# Patient Record
Sex: Female | Born: 1960 | Race: White | Hispanic: No | Smoking: Never smoker
Health system: Southern US, Community
[De-identification: ages and names within clinical notes are randomized; demographics above are authoritative.]

## PROBLEM LIST (undated history)

## (undated) DIAGNOSIS — D649 Anemia, unspecified: Secondary | ICD-10-CM

## (undated) DIAGNOSIS — G4730 Sleep apnea, unspecified: Secondary | ICD-10-CM

## (undated) DIAGNOSIS — I493 Ventricular premature depolarization: Secondary | ICD-10-CM

## (undated) DIAGNOSIS — B379 Candidiasis, unspecified: Secondary | ICD-10-CM

## (undated) DIAGNOSIS — K589 Irritable bowel syndrome without diarrhea: Secondary | ICD-10-CM

## (undated) HISTORY — PX: LASIK: SHX215

## (undated) HISTORY — DX: Sleep apnea, unspecified: G47.30

## (undated) HISTORY — DX: Irritable bowel syndrome, unspecified: K58.9

## (undated) HISTORY — DX: Candidiasis, unspecified: B37.9

## (undated) HISTORY — PX: FACIAL COSMETIC SURGERY: SHX629

## (undated) HISTORY — DX: Ventricular premature depolarization: I49.3

## (undated) HISTORY — DX: Anemia, unspecified: D64.9

---

## 2011-12-22 ENCOUNTER — Ambulatory Visit (INDEPENDENT_AMBULATORY_CARE_PROVIDER_SITE_OTHER): Payer: BC Managed Care – PPO | Admitting: Obstetrics and Gynecology

## 2011-12-22 ENCOUNTER — Encounter: Payer: Self-pay | Admitting: Obstetrics and Gynecology

## 2011-12-22 ENCOUNTER — Ambulatory Visit (INDEPENDENT_AMBULATORY_CARE_PROVIDER_SITE_OTHER): Payer: BC Managed Care – PPO

## 2011-12-22 ENCOUNTER — Other Ambulatory Visit: Payer: Self-pay | Admitting: Obstetrics and Gynecology

## 2011-12-22 VITALS — BP 110/76 | Ht 64.6 in | Wt 176.0 lb

## 2011-12-22 DIAGNOSIS — Z1231 Encounter for screening mammogram for malignant neoplasm of breast: Secondary | ICD-10-CM

## 2011-12-22 DIAGNOSIS — Z309 Encounter for contraceptive management, unspecified: Secondary | ICD-10-CM

## 2011-12-22 DIAGNOSIS — Z01419 Encounter for gynecological examination (general) (routine) without abnormal findings: Secondary | ICD-10-CM

## 2011-12-22 NOTE — Progress Notes (Signed)
The patient reports:normal menses, no abnormal bleeding, pelvic pain or discharge  Contraception:IUD Mirena since 2009  Last mammogram: was normal 2 years ago per pt  Last pap: was normal 2 years ago per pt   GC/Chlamydia cultures offered: declined HIV/RPR/HbsAg offered:  declined HSV 1 and 2 glycoprotein offered: declined  Menstrual cycle regular and monthly: No: IUD  Menstrual flow normal: No:IUD   Urinary symptoms: none Normal bowel movements: Yes Reports abuse at home: No  Subjective:    Sandra Benitez is a 51 y.o. female, G1P1001, who presents for an annual exam.     History   Social History  . Marital Status: Unknown    Spouse Name: N/A    Number of Children: N/A  . Years of Education: N/A   Social History Main Topics  . Smoking status: Never Smoker   . Smokeless tobacco: Never Used  . Alcohol Use: No  . Drug Use: No  . Sexually Active: Not Currently    Birth Control/ Protection: IUD   Other Topics Concern  . None   Social History Narrative  . None    Menstrual cycle:   LMP: No LMP recorded. Patient is not currently having periods (Reason: IUD).           Cycle: amenorrhea, occasional spotting  The following portions of the patient's history were reviewed and updated as appropriate: allergies, current medications, past family history, past medical history, past social history, past surgical history and problem list.  Review of Systems Pertinent items are noted in HPI. Breast:Negative for breast lump,nipple discharge or nipple retraction Gastrointestinal: Negative for abdominal pain, change in bowel habits or rectal bleeding Urinary: urgency with occasional incontinence, nycturia 0-1, no USI   Objective:    BP 110/76  Ht 5' 4.6" (1.641 m)  Wt 176 lb (79.833 kg)  BMI 29.65 kg/m2    Weight:  Wt Readings from Last 1 Encounters:  12/22/11 176 lb (79.833 kg)          BMI: Body mass index is 29.65 kg/(m^2).  General Appearance: Alert, appropriate appearance  for age. No acute distress HEENT: Grossly normal Neck / Thyroid: Supple, no masses, nodes or enlargement Lungs: clear to auscultation bilaterally Back: No CVA tenderness Breast Exam: No masses or nodes.No dimpling, nipple retraction or discharge.[place] todsat Cardiovascular: Regular rate and rhythm. S1, S2, no murmur Gastrointestinal: Soft, non-tender, no masses or organomegaly Pelvic Exam: Vulva and vagina appear normal. Bimanual exam reveals normal uterus and adnexa. slightly RV and strings not seen. Rectovaginal: normal rectal, no masses Lymphatic Exam: Non-palpable nodes in neck, clavicular, axillary, or inguinal regions Skin: no rash or abnormalities Neurologic: Normal gait and speech, no tremor  Psychiatric: Alert and oriented, appropriate affect.   Ultrasound : IUD in uterus and well placed   3 small fibroids  <3 cm   Assessment:    Normal gyn exam    Plan:    mammogram pap smear return annually or prn STD screening: declined Contraception:IUD      Freida Busman JMD

## 2011-12-25 LAB — PAP IG W/ RFLX HPV ASCU

## 2012-01-17 ENCOUNTER — Ambulatory Visit: Payer: Self-pay

## 2012-02-12 ENCOUNTER — Ambulatory Visit
Admission: RE | Admit: 2012-02-12 | Discharge: 2012-02-12 | Disposition: A | Discharge status: Home / Self Care | Payer: BC Managed Care – PPO | Source: Ambulatory Visit | Attending: Obstetrics and Gynecology | Admitting: Obstetrics and Gynecology

## 2012-02-12 DIAGNOSIS — Z1231 Encounter for screening mammogram for malignant neoplasm of breast: Secondary | ICD-10-CM

## 2012-02-15 ENCOUNTER — Encounter: Payer: Self-pay | Admitting: Obstetrics and Gynecology

## 2012-02-15 NOTE — Progress Notes (Signed)
Quick Note:  Please send "Dense breast" letter to patient and document in chart when letter is sent. ______ 

## 2013-01-14 ENCOUNTER — Other Ambulatory Visit: Payer: Self-pay

## 2013-01-14 DIAGNOSIS — Z1231 Encounter for screening mammogram for malignant neoplasm of breast: Secondary | ICD-10-CM

## 2013-02-12 ENCOUNTER — Ambulatory Visit
Admission: RE | Admit: 2013-02-12 | Discharge: 2013-02-12 | Disposition: A | Discharge status: Home / Self Care | Payer: 59 | Source: Ambulatory Visit

## 2013-02-12 DIAGNOSIS — Z1231 Encounter for screening mammogram for malignant neoplasm of breast: Secondary | ICD-10-CM

## 2013-04-03 LAB — HM COLONOSCOPY: HM Colonoscopy: NORMAL

## 2013-05-13 LAB — HM COLONOSCOPY: HM Colonoscopy: NORMAL

## 2014-01-26 ENCOUNTER — Other Ambulatory Visit: Payer: Self-pay

## 2014-01-26 DIAGNOSIS — Z1231 Encounter for screening mammogram for malignant neoplasm of breast: Secondary | ICD-10-CM

## 2014-02-16 ENCOUNTER — Ambulatory Visit
Admission: RE | Admit: 2014-02-16 | Discharge: 2014-02-16 | Disposition: A | Discharge status: Home / Self Care | Payer: 59 | Source: Ambulatory Visit

## 2014-02-16 DIAGNOSIS — Z1231 Encounter for screening mammogram for malignant neoplasm of breast: Secondary | ICD-10-CM

## 2015-02-02 LAB — HM PAP SMEAR

## 2015-02-02 LAB — HM MAMMOGRAPHY: HM Mammogram: NORMAL (ref 0–4)

## 2015-05-15 ENCOUNTER — Emergency Department (HOSPITAL_COMMUNITY): Payer: 59

## 2015-05-15 ENCOUNTER — Emergency Department (HOSPITAL_COMMUNITY)
Admission: EM | Admit: 2015-05-15 | Discharge: 2015-05-15 | Disposition: A | Discharge status: Home / Self Care | Payer: 59 | Attending: Emergency Medicine | Admitting: Emergency Medicine

## 2015-05-15 ENCOUNTER — Encounter (HOSPITAL_COMMUNITY): Payer: Self-pay | Admitting: Emergency Medicine

## 2015-05-15 DIAGNOSIS — Y9289 Other specified places as the place of occurrence of the external cause: Secondary | ICD-10-CM | POA: Insufficient documentation

## 2015-05-15 DIAGNOSIS — Z8719 Personal history of other diseases of the digestive system: Secondary | ICD-10-CM | POA: Insufficient documentation

## 2015-05-15 DIAGNOSIS — Y9389 Activity, other specified: Secondary | ICD-10-CM | POA: Insufficient documentation

## 2015-05-15 DIAGNOSIS — F419 Anxiety disorder, unspecified: Secondary | ICD-10-CM | POA: Diagnosis not present

## 2015-05-15 DIAGNOSIS — S92512A Displaced fracture of proximal phalanx of left lesser toe(s), initial encounter for closed fracture: Secondary | ICD-10-CM | POA: Insufficient documentation

## 2015-05-15 DIAGNOSIS — Z880 Allergy status to penicillin: Secondary | ICD-10-CM | POA: Diagnosis not present

## 2015-05-15 DIAGNOSIS — S99922A Unspecified injury of left foot, initial encounter: Secondary | ICD-10-CM | POA: Diagnosis present

## 2015-05-15 DIAGNOSIS — Y998 Other external cause status: Secondary | ICD-10-CM | POA: Insufficient documentation

## 2015-05-15 DIAGNOSIS — Z862 Personal history of diseases of the blood and blood-forming organs and certain disorders involving the immune mechanism: Secondary | ICD-10-CM | POA: Diagnosis not present

## 2015-05-15 DIAGNOSIS — Z8619 Personal history of other infectious and parasitic diseases: Secondary | ICD-10-CM | POA: Diagnosis not present

## 2015-05-15 DIAGNOSIS — W2203XA Walked into furniture, initial encounter: Secondary | ICD-10-CM | POA: Diagnosis not present

## 2015-05-15 DIAGNOSIS — S92912A Unspecified fracture of left toe(s), initial encounter for closed fracture: Secondary | ICD-10-CM

## 2015-05-15 MED ORDER — IBUPROFEN 800 MG PO TABS: 800.0000 mg | ORAL_TABLET | Freq: Three times a day (TID) | ORAL | Status: DC

## 2015-05-15 MED ORDER — LORAZEPAM 0.5 MG PO TABS
1.0000 mg | ORAL_TABLET | Freq: Once | ORAL | Status: AC
  Administered 2015-05-15: 1 mg via ORAL
  Filled 2015-05-15: qty 2

## 2015-05-15 MED ORDER — OXYCODONE-ACETAMINOPHEN 5-325 MG PO TABS: 1.0000 | ORAL_TABLET | ORAL | Status: DC | PRN

## 2015-05-15 MED ORDER — BUPIVACAINE HCL (PF) 0.5 % IJ SOLN
5.0000 mL | Freq: Once | INTRAMUSCULAR | Status: AC
  Administered 2015-05-15: 5 mL
  Filled 2015-05-15: qty 10

## 2015-05-15 NOTE — ED Notes (Signed)
Pt reports she jammed her left pinky toe into coffee table this morning and noticed it was crooked. Some bruising noted to joint. Pain 6/10 that comes and goes. PT has not taken anything for the pain.

## 2015-05-15 NOTE — Discharge Instructions (Signed)
You have been seen today for toe pain. Your x-ray showed a fracture of the left small toe. Follow up with PCP as needed for follow-up evaluation and chronic management of pain. Return to ED should symptoms worsen.

## 2015-05-15 NOTE — ED Provider Notes (Signed)
CSN: [id number]     Arrival date & time 05/15/15  1255 History   First MD Initiated Contact with Patient 05/15/15 1331     Chief Complaint  Patient presents with  . Toe Injury     (Consider location/radiation/quality/duration/timing/severity/associated sxs/prior Treatment) HPI   Sandra Benitez is a 55 y.o. female, with past medical history of anxiety, presenting to the ED with left small toe pain and deformity that occurred when the patient ran into a coffee table earlier today. Patient denies falling or head trauma. Patient rates her pain at 6 out of 10, dull alternating with shooting pain, nonradiating. Patient has not taken anything for pain prior to arrival. Patient adds that the pain in her toe and the thought of her toe being angulated is making her a bit anxious and requested antianxiety medication. Patient denies difficulty ambulating, loss of balance, neuro deficits, other injuries, or any other complaints.    Past Medical History  Diagnosis Date  . Yeast infection   . IBS (irritable bowel syndrome)   . Anemia    No past surgical history on file. Family History  Problem Relation Age of Onset  . Colon cancer Maternal Grandfather   . Heart disease Father   . Stroke Father   . Hypertension Father   . Heart disease Brother   . Stroke Brother   . Hypertension Brother    Social History  Substance Use Topics  . Smoking status: Never Smoker   . Smokeless tobacco: Never Used  . Alcohol Use: No   OB History    Gravida Para Term Preterm AB TAB SAB Ectopic Multiple Living   1 1 1       1      Review of Systems  Musculoskeletal: Positive for arthralgias (Left small toe pain and deformity).  Neurological: Negative for weakness and numbness.  Psychiatric/Behavioral: The patient is nervous/anxious.       Allergies  Penicillins  Home Medications   Prior to Admission medications   Medication Sig Start Date End Date Taking? Authorizing Provider  carbidopa-levodopa (PARCOPA)  25-100 MG per disintegrating tablet Take 1 tablet by mouth 3 (three) times daily.    Historical Provider, MD  cetirizine (ZYRTEC) 10 MG tablet Take 10 mg by mouth daily.    Historical Provider, MD  escitalopram (LEXAPRO) 10 MG tablet Take 10 mg by mouth daily.    Historical Provider, MD  ibuprofen (ADVIL,MOTRIN) 800 MG tablet Take 1 tablet (800 mg total) by mouth 3 (three) times daily. 05/15/15   Lorayne Bender, PA-C  levonorgestrel (MIRENA) 20 MCG/24HR IUD 1 each by Intrauterine route once.    Historical Provider, MD  meloxicam (MOBIC) 15 MG tablet Take 15 mg by mouth daily.    Historical Provider, MD  oxyCODONE-acetaminophen (PERCOCET/ROXICET) 5-325 MG tablet Take 1-2 tablets by mouth every 4 (four) hours as needed for severe pain. 05/15/15   Armanie Martine C Astorino, PA-C   BP 135/74 mmHg  Pulse 68  Temp(Src) 99.2 F (37.3 C) (Oral)  Resp 18  Ht 5' 4.5" (1.638 m)  Wt 74.345 kg  BMI 27.71 kg/m2  SpO2 99%  LMP 05/02/2015 Physical Exam  Constitutional: She appears well-developed and well-nourished. No distress.  HENT:  Head: Normocephalic and atraumatic.  Eyes: Conjunctivae are normal. Pupils are equal, round, and reactive to light.  Cardiovascular: Normal rate and regular rhythm.   Pulmonary/Chest: Effort normal.  Musculoskeletal:  Left small toe somewhat angulated laterally. CMS is intact. No overlying wound or break in the  skin. No skin tenting.  Neurological: She is alert.  Skin: Skin is warm and dry. She is not diaphoretic.  Nursing note and vitals reviewed.   ED Course  .Nerve Block Date/Time: 05/15/2015 2:38 PM Performed by: Lorayne Bender Authorized by: Lorayne Bender Consent: Verbal consent obtained. Risks and benefits: risks, benefits and alternatives were discussed Consent given by: patient Patient understanding: patient states understanding of the procedure being performed Patient consent: the patient's understanding of the procedure matches consent given Procedure consent: procedure  consent matches procedure scheduled Patient identity confirmed: verbally with patient and arm band Time out: Immediately prior to procedure a "time out" was called to verify the correct patient, procedure, equipment, support staff and site/side marked as required. Indications: pain relief and fracture Body area: lower extremity Nerve: digital Laterality: left Patient sedated: no Patient position: supine Needle gauge: 25 G Location technique: anatomical landmarks Local anesthetic: bupivacaine 0.5% without epinephrine Anesthetic total: 2 ml Outcome: pain improved Patient tolerance: Patient tolerated the procedure well with no immediate complications  Reduction of fracture Date/Time: 05/15/2015 2:39 PM Performed by: Lorayne Bender Authorized by: Arlean Hopping C Consent: Verbal consent obtained. Risks and benefits: risks, benefits and alternatives were discussed Consent given by: patient Patient understanding: patient states understanding of the procedure being performed Patient consent: the patient's understanding of the procedure matches consent given Procedure consent: procedure consent matches procedure scheduled Patient identity confirmed: verbally with patient and arm band Time out: Immediately prior to procedure a "time out" was called to verify the correct patient, procedure, equipment, support staff and site/side marked as required. Local anesthesia used: yes Anesthesia: digital block Local anesthetic: bupivacaine 0.5% without epinephrine Anesthetic total: 2 ml Patient sedated: no Patient tolerance: Patient tolerated the procedure well with no immediate complications  .Splint Application Date/Time: [id number] 2:39 PM Performed by: Lorayne Bender Authorized by: Lorayne Bender Consent: Verbal consent obtained. Risks and benefits: risks, benefits and alternatives were discussed Consent given by: patient Patient understanding: patient states understanding of the procedure being  performed Patient consent: the patient's understanding of the procedure matches consent given Procedure consent: procedure consent matches procedure scheduled Patient identity confirmed: verbally with patient and arm band Location: Left small toe. Splint type: Buddy taping and postop shoe. Post-procedure: The splinted body part was neurovascularly unchanged following the procedure. Patient tolerance: Patient tolerated the procedure well with no immediate complications   (including critical care time)  Imaging Review Dg Foot Complete Left  05/15/2015  CLINICAL DATA:  Pain after hitting left fifth toe on coffee table EXAM: LEFT FOOT - COMPLETE 3+ VIEW COMPARISON:  None. FINDINGS: Frontal, oblique, and lateral views were obtained. There is an obliquely oriented fracture of the distal aspect of the fifth proximal phalanx with lateral angulation distally. No other fracture. No dislocation. Joint spaces appear intact. No erosive change. IMPRESSION: Obliquely oriented fracture distal aspect fifth proximal phalanx with lateral angulation distally. No other fracture. No dislocation. No appreciable arthropathy. Electronically Signed   By: Lowella Grip III M.D.   On: 05/15/2015 14:09   I have personally reviewed and evaluated these images as part of my medical decision-making.   EKG Interpretation None      MDM   Final diagnoses:  Toe fracture, left, closed, initial encounter    Sandra Benitez presents with left small toe pain and deformity that occurred this morning.  Patient was found to have an oblique fracture of her left small toe with angulation. Angulation of the toe was  causing patient apprehension. Patient's anxiety was reduced following a dose of Ativan. Patient tolerated the fracture reduction rather well. CMS was intact before and after the procedure. The patient was given instructions for home care as well as return precautions. Patient voices understanding of these instructions,  accepts the plan, and is comfortable with discharge.  Sandra KARPMAN, PA-C 05/15/15 [place], MD 05/16/15 [phone]

## 2015-05-25 ENCOUNTER — Emergency Department (HOSPITAL_COMMUNITY)
Admission: EM | Admit: 2015-05-25 | Discharge: 2015-05-25 | Disposition: A | Discharge status: Home / Self Care | Payer: 59 | Attending: Emergency Medicine | Admitting: Emergency Medicine

## 2015-05-25 ENCOUNTER — Emergency Department (HOSPITAL_COMMUNITY): Payer: 59

## 2015-05-25 ENCOUNTER — Encounter (HOSPITAL_COMMUNITY): Payer: Self-pay | Admitting: *Deleted

## 2015-05-25 DIAGNOSIS — Z880 Allergy status to penicillin: Secondary | ICD-10-CM | POA: Insufficient documentation

## 2015-05-25 DIAGNOSIS — Z862 Personal history of diseases of the blood and blood-forming organs and certain disorders involving the immune mechanism: Secondary | ICD-10-CM | POA: Diagnosis not present

## 2015-05-25 DIAGNOSIS — Z8619 Personal history of other infectious and parasitic diseases: Secondary | ICD-10-CM | POA: Diagnosis not present

## 2015-05-25 DIAGNOSIS — K589 Irritable bowel syndrome without diarrhea: Secondary | ICD-10-CM | POA: Insufficient documentation

## 2015-05-25 DIAGNOSIS — Z79899 Other long term (current) drug therapy: Secondary | ICD-10-CM | POA: Insufficient documentation

## 2015-05-25 DIAGNOSIS — R079 Chest pain, unspecified: Secondary | ICD-10-CM

## 2015-05-25 DIAGNOSIS — Z791 Long term (current) use of non-steroidal anti-inflammatories (NSAID): Secondary | ICD-10-CM | POA: Insufficient documentation

## 2015-05-25 LAB — I-STAT TROPONIN, ED
Troponin i, poc: 0 ng/mL (ref 0.00–0.08)
Troponin i, poc: 0 ng/mL (ref 0.00–0.08)

## 2015-05-25 LAB — BASIC METABOLIC PANEL
ANION GAP: 8 (ref 5–15)
BUN: 18 mg/dL (ref 6–20)
CALCIUM: 9.3 mg/dL (ref 8.9–10.3)
CHLORIDE: 105 mmol/L (ref 101–111)
CO2: 26 mmol/L (ref 22–32)
CREATININE: 0.75 mg/dL (ref 0.44–1.00)
GFR calc non Af Amer: >60 mL/min (ref >60)
GLUCOSE: 109 mg/dL — AB (ref 65–99)
Potassium: 4.4 mmol/L (ref 3.5–5.1)
Sodium: 139 mmol/L (ref 135–145)

## 2015-05-25 LAB — CBC
HCT: 41.1 % (ref 36.0–46.0)
Hemoglobin: 13.3 g/dL (ref 12.0–15.0)
MCH: 29.6 pg (ref 26.0–34.0)
MCHC: 32.4 g/dL (ref 30.0–36.0)
MCV: 91.5 fL (ref 78.0–100.0)
Platelets: 398 K/uL (ref 150–400)
RBC: 4.49 MIL/uL (ref 3.87–5.11)
RDW: 13.6 % (ref 11.5–15.5)
WBC: 4.7 K/uL (ref 4.0–10.5)

## 2015-05-25 LAB — D-DIMER, QUANTITATIVE: D-Dimer, Quant: <0.27 ug{FEU}/mL (ref 0.00–0.50)

## 2015-05-25 MED ORDER — OMEPRAZOLE 20 MG PO CPDR: 20.0000 mg | DELAYED_RELEASE_CAPSULE | Freq: Every day | ORAL | Status: DC

## 2015-05-25 NOTE — ED Provider Notes (Signed)
CSN: [id number]     Arrival date & time 05/25/15  1002 History   First MD Initiated Contact with Patient 05/25/15 1725     Chief Complaint  Patient presents with  . Chest Pain      HPI  Patient presents for evaluation of chest pain since yesterday. She had an episode yesterday where she felt some discomfort in her arm. Then a few minutes there is a sharp pain in her left lower ribs. This lasted less than a few minutes. 20 she was driving to work. She was having a cup of coffee. She was on a phone call. She felt some discomfort in her low substernal chest or epigastrium. It has been intermittent today. Lasting several hours at a time. May wax and wane but is almost never completely resolved. No shortness of breath. No nausea. No significant heartburn symptoms. No cough or fever. Only significant recent history is 10 days ago she fractured her toe. She's been in a cast shoe and on crutches.  History of a cystlike syndrome, seasonal allergies, depression, and palpitations.  Brother had an MI and stroke, father had a stroke. She is a lifetime nonsmoker. No history of hypertension diabetes cholesterolemia.  Past Medical History  Diagnosis Date  . Yeast infection   . IBS (irritable bowel syndrome)   . Anemia    History reviewed. No pertinent past surgical history. Family History  Problem Relation Age of Onset  . Colon cancer Maternal Grandfather   . Heart disease Father   . Stroke Father   . Hypertension Father   . Heart disease Brother   . Stroke Brother   . Hypertension Brother    Social History  Substance Use Topics  . Smoking status: Never Smoker   . Smokeless tobacco: Never Used  . Alcohol Use: No   OB History    Gravida Para Term Preterm AB TAB SAB Ectopic Multiple Living   1 1 1       1      Review of Systems  Constitutional: Negative for fever, chills, diaphoresis, appetite change and fatigue.  HENT: Negative for mouth sores, sore throat and trouble swallowing.    Eyes: Negative for visual disturbance.  Respiratory: Negative for cough, chest tightness, shortness of breath and wheezing.   Cardiovascular: Positive for chest pain.  Gastrointestinal: Negative for nausea, vomiting, abdominal pain, diarrhea and abdominal distention.  Endocrine: Negative for polydipsia, polyphagia and polyuria.  Genitourinary: Negative for dysuria, frequency and hematuria.  Musculoskeletal: Negative for gait problem.  Skin: Negative for color change, pallor and rash.  Neurological: Negative for dizziness, syncope, light-headedness and headaches.  Hematological: Does not bruise/bleed easily.  Psychiatric/Behavioral: Negative for behavioral problems and confusion.      Allergies  Penicillins and Other  Home Medications   Prior to Admission medications   Medication Sig Start Date End Date Taking? Authorizing Provider  carbidopa-levodopa (PARCOPA) 25-100 MG per disintegrating tablet Take 1 tablet by mouth at bedtime.    Yes Historical Provider, MD  cetirizine (ZYRTEC) 10 MG tablet Take 10 mg by mouth daily.   Yes Historical Provider, MD  escitalopram (LEXAPRO) 5 MG tablet Take 5 mg by mouth daily.   Yes Historical Provider, MD  ibuprofen (ADVIL,MOTRIN) 800 MG tablet Take 1 tablet (800 mg total) by mouth 3 (three) times daily. Patient taking differently: Take 800 mg by mouth every 6 (six) hours as needed for moderate pain.  05/15/15  Yes Shawn C Ikner, PA-C  levonorgestrel (MIRENA) 20 MCG/24HR IUD  1 each by Intrauterine route once.   Yes Historical Provider, MD  meloxicam (MOBIC) 15 MG tablet Take 15 mg by mouth every other day.    Yes Historical Provider, MD  metoprolol tartrate (LOPRESSOR) 25 MG tablet Take 12.5 mg by mouth 2 (two) times daily.   Yes Historical Provider, MD  oxyCODONE-acetaminophen (PERCOCET/ROXICET) 5-325 MG tablet Take 1-2 tablets by mouth every 4 (four) hours as needed for severe pain. 05/15/15  Yes Shawn C Eutsler, PA-C  omeprazole (PRILOSEC) 20 MG capsule  Take 1 capsule (20 mg total) by mouth daily. 05/25/15   Tanna Furry, MD   BP 115/71 mmHg  Pulse 69  Temp(Src) 97.6 F (36.4 C) (Oral)  Resp 16  SpO2 100%  LMP 05/20/2015 Physical Exam  Constitutional: She is oriented to person, place, and time. She appears well-developed and well-nourished. No distress.  HENT:  Head: Normocephalic.  Eyes: Conjunctivae are normal. Pupils are equal, round, and reactive to light. No scleral icterus.  Neck: Normal range of motion. Neck supple. No thyromegaly present.  Cardiovascular: Normal rate and regular rhythm.  Exam reveals no gallop and no friction rub.   No murmur heard. Pulmonary/Chest: Effort normal and breath sounds normal. No respiratory distress. She has no wheezes. She has no rales.  Abdominal: Soft. Bowel sounds are normal. She exhibits no distension. There is no tenderness. There is no rebound.  Musculoskeletal: Normal range of motion.  Neurological: She is alert and oriented to person, place, and time.  Skin: Skin is warm and dry. No rash noted.  Psychiatric: She has a normal mood and affect. Her behavior is normal.    ED Course  Procedures (including critical care time) Labs Review Labs Reviewed  BASIC METABOLIC PANEL - Abnormal; Notable for the following:    Glucose, Bld 109 (*)    All other components within normal limits  CBC  D-DIMER, QUANTITATIVE (NOT AT [facility])  I-STAT TROPOININ, ED  Randolm Idol, ED    Imaging Review Dg Chest 2 View  05/25/2015  CLINICAL DATA:  Chest pain starting today, chest discomfort yesterday on the left side EXAM: CHEST  2 VIEW COMPARISON:  None. FINDINGS: Cardiomediastinal silhouette is unremarkable. No acute infiltrate or pleural effusion. No pulmonary edema. Bony thorax is unremarkable. IMPRESSION: No active cardiopulmonary disease. Electronically Signed   By: Lahoma Crocker M.D.   On: 05/25/2015 10:55   I have personally reviewed and evaluated these images and lab results as part of my medical  decision-making.   EKG Interpretation   Date/Time:  Tuesday May 25 2015 10:20:14 EST Ventricular Rate:  57 PR Interval:  138 QRS Duration: 76 QT Interval:  390 QTC Calculation: 379 R Axis:   81 Text Interpretation:  Sinus bradycardia Otherwise normal ECG Confirmed by  Jeneen Rinks  MD, [place] ([id number]) on 05/25/2015 5:26:29 PM      MDM   Final diagnoses:  Chest pain, unspecified chest pain type    Heart score H=1, EKG =0, A+1, R=1, T=0  ==3.  Patient with near persistence of symptoms since yesterday. Normal troponin. Normal repeat is 7 hours. Normal EKG. Has a fractured toe. No significant immobilization. However, d-dimer was obtained and is normal. Is not tachycardic or hypoxemic. Clear lungs. Normal chest x-ray. Appropriate for outpatient follow-up. Aspirin daily. Prilosec prescription, ranges.    Tanna Furry, MD 05/25/15 [phone]

## 2015-05-25 NOTE — Discharge Instructions (Signed)
81 mg baby aspirin per day.  Call cardiologist for outpatient follow-up  Prilosec as written. Avoid anti-inflamatory medications, and excess caffeine/alcohol.  ER with any new, worsening, or changing symptoms

## 2015-05-25 NOTE — ED Notes (Signed)
Pt reports chest pressure and SOB that started yesterday. Pt states that she had an ache in her left arm as well yesterday. NAD noted in triage.

## 2015-09-14 ENCOUNTER — Ambulatory Visit: Payer: 59 | Admitting: Cardiovascular Disease

## 2015-09-22 ENCOUNTER — Ambulatory Visit (INDEPENDENT_AMBULATORY_CARE_PROVIDER_SITE_OTHER): Payer: 59 | Admitting: Family Medicine

## 2015-09-22 ENCOUNTER — Encounter: Payer: Self-pay | Admitting: Family Medicine

## 2015-09-22 VITALS — BP 120/76 | HR 76 | Temp 98.3°F | Resp 16 | Ht 64.0 in | Wt 165.1 lb

## 2015-09-22 DIAGNOSIS — G4733 Obstructive sleep apnea (adult) (pediatric): Secondary | ICD-10-CM | POA: Insufficient documentation

## 2015-09-22 DIAGNOSIS — R0683 Snoring: Secondary | ICD-10-CM | POA: Diagnosis not present

## 2015-09-22 NOTE — Progress Notes (Signed)
Pre visit review using our clinic review tool, if applicable. No additional management support is needed unless otherwise documented below in the visit note. 

## 2015-09-22 NOTE — Patient Instructions (Signed)
Follow up as scheduled We'll call you with your Pulmonary appt for the snoring Continue to take the Zyrtec daily Drink plenty of fluids Call with any questions or concerns Welcome!! We're glad to have you!!!

## 2015-09-22 NOTE — Assessment & Plan Note (Signed)
New.  Pt's sxs are concerning for possible sleep apnea.  Refer to pulmonary for complete evaluation and determination if sleep study is required.  Reviewed dx and possible treatment options.  Will follow closely.

## 2015-09-22 NOTE — Progress Notes (Signed)
   Subjective:    Patient ID: Sandra Benitez, female    DOB: 1960-12-21, 55 y.o.   MRN: [id number]  HPI New to establish.  Previous MD- Minna Antis ([place]), then Dr Maudie Mercury.  Snoring- pt was recently told that 'I snore like a freight train'.  No obvious breathing pauses but travel companions had concerns.  Is waking 'every hour'.  Wakes exhausted.  Has sensation of throat closing when lying on her back.  + daytime fatigue.  On Zyrtec daily   Review of Systems For ROS see HPI     Objective:   Physical Exam  Constitutional: She is oriented to person, place, and time. She appears well-developed and well-nourished. No distress.  HENT:  Head: Normocephalic and atraumatic.  Right Ear: Tympanic membrane normal.  Left Ear: Tympanic membrane normal.  Nose: Mucosal edema and rhinorrhea present. Right sinus exhibits no maxillary sinus tenderness and no frontal sinus tenderness. Left sinus exhibits no maxillary sinus tenderness and no frontal sinus tenderness.  Mouth/Throat: Mucous membranes are normal. Posterior oropharyngeal erythema (w/ PND) present.  Narrow palantine arch  Eyes: Conjunctivae and EOM are normal. Pupils are equal, round, and reactive to light.  Neck: Normal range of motion. Neck supple.  Cardiovascular: Normal rate, regular rhythm and normal heart sounds.   Pulmonary/Chest: Effort normal and breath sounds normal. No respiratory distress. She has no wheezes. She has no rales.  Lymphadenopathy:    She has no cervical adenopathy.  Neurological: She is alert and oriented to person, place, and time.  Skin: Skin is warm and dry.  Psychiatric: She has a normal mood and affect. Her behavior is normal. Thought content normal.  Vitals reviewed.         Assessment & Plan:

## 2015-09-27 ENCOUNTER — Encounter: Payer: Self-pay | Admitting: Internal Medicine

## 2015-09-27 ENCOUNTER — Ambulatory Visit (INDEPENDENT_AMBULATORY_CARE_PROVIDER_SITE_OTHER): Payer: 59 | Admitting: Internal Medicine

## 2015-09-27 VITALS — BP 130/70 | HR 70 | Ht 64.5 in | Wt 168.8 lb

## 2015-09-27 DIAGNOSIS — R0683 Snoring: Secondary | ICD-10-CM

## 2015-09-27 DIAGNOSIS — G2581 Restless legs syndrome: Secondary | ICD-10-CM

## 2015-09-27 DIAGNOSIS — G4733 Obstructive sleep apnea (adult) (pediatric): Secondary | ICD-10-CM

## 2015-09-27 NOTE — Progress Notes (Signed)
09/27/2015-55 year old female never smoker-pt ref by Dr. Annye Asa. pt c/o loud snoring, restless sleep, daytime sleepiness, feels throat closes when laying flat. EPWORTH:17 Patient reports loud snoring and repeated waking during the night, feels her throat closing that she falls asleep, excessive daytime sleepiness next day. Avoids naps because she tends to wake with headache. ENT surgery-none. Denies lung disease. Occasional palpitation otherwise no heart trouble. Takes levodopa-carbidopa for diagnosed Restless Legs. Denies sleepwalking. Denies history of seizure disorder or family history of Parkinson's. Extensive cardiovascular disease history in the family.  Prior to Admission medications   Medication Sig Start Date End Date Taking? Authorizing Provider  ALPRAZolam (XANAX) 0.25 MG tablet Take 0.25 mg by mouth 3 (three) times daily. 08/10/15  Yes Historical Provider, MD  carbidopa-levodopa (PARCOPA) 25-100 MG per disintegrating tablet Take 1 tablet by mouth at bedtime.    Yes Historical Provider, MD  cetirizine (ZYRTEC) 10 MG tablet Take 10 mg by mouth daily.   Yes Historical Provider, MD  escitalopram (LEXAPRO) 5 MG tablet Take 5 mg by mouth daily.   Yes Historical Provider, MD  ibuprofen (ADVIL,MOTRIN) 800 MG tablet Take 1 tablet (800 mg total) by mouth 3 (three) times daily. Patient taking differently: Take 800 mg by mouth every 6 (six) hours as needed for moderate pain.  05/15/15  Yes Shawn C Sachdev, PA-C  levonorgestrel (MIRENA) 20 MCG/24HR IUD 1 each by Intrauterine route once.   Yes Historical Provider, MD  metoprolol tartrate (LOPRESSOR) 25 MG tablet Take 12.5 mg by mouth daily.    Yes Historical Provider, MD  omeprazole (PRILOSEC) 20 MG capsule Take 1 capsule (20 mg total) by mouth daily. 05/25/15  Yes Tanna Furry, MD   Past Medical History  Diagnosis Date  . Yeast infection   . IBS (irritable bowel syndrome)   . Anemia    No past surgical history on file. Family History   Problem Relation Age of Onset  . Colon cancer Maternal Grandfather   . Heart disease Father   . Stroke Father   . Hypertension Father   . Hyperlipidemia Father   . Heart disease Brother   . Stroke Brother   . Hypertension Brother   . Diverticulitis Mother   . Alcohol abuse Brother    Social History   Social History  . Marital Status: Unknown    Spouse Name: N/A  . Number of Children: N/A  . Years of Education: N/A   Occupational History  . Not on file.   Social History Main Topics  . Smoking status: Never Smoker   . Smokeless tobacco: Never Used  . Alcohol Use: No  . Drug Use: No  . Sexual Activity: Not Currently    Birth Control/ Protection: IUD     Comment: MIRENA inserted 09-20-09   Other Topics Concern  . Not on file   Social History Narrative   ROS-see HPI   Negative unless "+" Constitutional:    weight loss, night sweats, fevers, chills, fatigue, lassitude. HEENT:    headaches, difficulty swallowing, tooth/dental problems, sore throat,       sneezing, itching, ear ache, nasal congestion, post nasal drip, snoring CV:    chest pain, orthopnea, PND, swelling in lower extremities, anasarca,  dizziness, palpitations Resp:   shortness of breath with exertion or at rest.                productive cough,   non-productive cough, coughing up of blood.              change in color of mucus.  wheezing.   Skin:    rash or lesions. GI:  No-   heartburn, indigestion, abdominal pain, nausea, vomiting, diarrhea,                 change in bowel habits, loss of appetite GU: dysuria, change in color of urine, no urgency or frequency.   flank pain. MS:   joint pain, stiffness, decreased range of motion, back pain. Neuro-     nothing unusual Psych:  change in mood or affect.  depression or anxiety.   memory loss.  OBJ- Physical Exam General- Alert, Oriented, Affect-appropriate, Distress- none acute Skin- rash-none, lesions-  none, excoriation- none Lymphadenopathy- none Head- atraumatic            Eyes- Gross vision intact, PERRLA, conjunctivae and secretions clear            Ears- Hearing, canals-normal            Nose- Clear, no-Septal dev, mucus, polyps, erosion, perforation             Throat- Mallampati III , mucosa clear , drainage- none, tonsils- atrophic Neck- flexible , trachea midline, no stridor , thyroid nl, carotid no bruit Chest - symmetrical excursion , unlabored           Heart/CV- RRR , no murmur , no gallop  , no rub, nl s1 s2                           - JVD- none , edema- none, stasis changes- none, varices- none           Lung- clear to P&A, wheeze- none, cough- none , dullness-none, rub- none           Chest wall-  Abd-  Br/ Gen/ Rectal- Not done, not indicated Extrem- cyanosis- none, clubbing, none, atrophy- none, strength- nl Neuro- grossly intact to observation

## 2015-09-27 NOTE — Patient Instructions (Signed)
Order- schedule split protocol NPSG or HST         Dx OSA, Restless Legs  We will work with you to schedule an office follow-up after your study

## 2015-09-28 DIAGNOSIS — G2581 Restless legs syndrome: Secondary | ICD-10-CM | POA: Insufficient documentation

## 2015-09-28 NOTE — Assessment & Plan Note (Signed)
History and physical suggest strong likelihood for obstructive sleep apnea. Describes some palpitations but not atrial fibrillation so far. Plan-schedule sleep study then return for follow-up. Preliminary discussion done about medical concerns with this condition, basic sleep hygiene, her responsibility to drive safely.

## 2015-09-28 NOTE — Assessment & Plan Note (Signed)
She gets significant benefit from levodopa-carbidopa.

## 2015-10-21 ENCOUNTER — Telehealth: Payer: Self-pay | Admitting: Internal Medicine

## 2015-10-21 DIAGNOSIS — G4733 Obstructive sleep apnea (adult) (pediatric): Secondary | ICD-10-CM

## 2015-10-21 NOTE — Telephone Encounter (Signed)
Pt will need to be made aware of change prior to changing order and then have PCC's cancel split night at sleep center. Pt will need 2-3 week (approx) OV after date of HST to follow up for results.     LMTCB

## 2015-10-21 NOTE — Telephone Encounter (Signed)
Pt returning call to [place].Hillery Hunter

## 2015-10-25 NOTE — Telephone Encounter (Signed)
Spoke with patient- she is aware that [facility] requests her to have HST since Insurance will not cover split night study. Pt is aware our PCC's will contact her to set up pick up date and time for HST machine-pt will have 2-3 week OV with [facility] scheduled the same day she picks up HST machine. Nothing more needed at this time.    Order placed for HST and requested PCC's to cancel Split night study as well.

## 2015-11-10 DIAGNOSIS — G4733 Obstructive sleep apnea (adult) (pediatric): Secondary | ICD-10-CM | POA: Diagnosis not present

## 2015-11-16 ENCOUNTER — Encounter (HOSPITAL_BASED_OUTPATIENT_CLINIC_OR_DEPARTMENT_OTHER): Payer: 59

## 2015-11-18 DIAGNOSIS — G4733 Obstructive sleep apnea (adult) (pediatric): Secondary | ICD-10-CM | POA: Diagnosis not present

## 2015-11-19 ENCOUNTER — Other Ambulatory Visit: Payer: Self-pay | Admitting: *Deleted

## 2015-11-19 DIAGNOSIS — G4733 Obstructive sleep apnea (adult) (pediatric): Secondary | ICD-10-CM

## 2015-11-24 ENCOUNTER — Encounter: Payer: Self-pay | Admitting: Emergency Medicine

## 2015-11-29 ENCOUNTER — Encounter: Payer: Self-pay | Admitting: Family Medicine

## 2015-11-29 ENCOUNTER — Ambulatory Visit (INDEPENDENT_AMBULATORY_CARE_PROVIDER_SITE_OTHER): Payer: 59 | Admitting: Family Medicine

## 2015-11-29 VITALS — BP 126/74 | HR 64 | Temp 98.1°F | Resp 16 | Ht 65.0 in | Wt 172.5 lb

## 2015-11-29 DIAGNOSIS — F32A Depression, unspecified: Secondary | ICD-10-CM

## 2015-11-29 DIAGNOSIS — F419 Anxiety disorder, unspecified: Secondary | ICD-10-CM

## 2015-11-29 DIAGNOSIS — F418 Other specified anxiety disorders: Secondary | ICD-10-CM

## 2015-11-29 DIAGNOSIS — R002 Palpitations: Secondary | ICD-10-CM

## 2015-11-29 DIAGNOSIS — G2581 Restless legs syndrome: Secondary | ICD-10-CM | POA: Diagnosis not present

## 2015-11-29 DIAGNOSIS — F329 Major depressive disorder, single episode, unspecified: Secondary | ICD-10-CM

## 2015-11-29 MED ORDER — MELOXICAM 15 MG PO TABS: 15.0000 mg | ORAL_TABLET | Freq: Every day | ORAL | 3 refills | Status: DC

## 2015-11-29 MED ORDER — CARBIDOPA-LEVODOPA 25-100 MG PO TABS: 1.0000 | ORAL_TABLET | Freq: Every day | ORAL | 3 refills | Status: DC

## 2015-11-29 NOTE — Progress Notes (Signed)
   Subjective:    Patient ID: Sandra Benitez, female    DOB: [birth year], 55 y.o.   MRN: [id number]  HPI RLS- pt is currently taking Carbidopa-Levodopa for sxs.  Pt reports this is controlling sxs pretty well.  If she forgets to take medication, she realizes she is not able to sleep w/o it.  Pt has been on this medication for 25ish yrs.  No adverse side effects or augmentation.  Palpitations- ongoing issue for pt.  Pt wore holter monitor and found she had PVCs.  Was given metoprolol as she was symptomatic.  Metoprolol 1/2 tab daily controls sxs which worsen w/ stress and caffeine.  No SOB, CP.  Anxiety/depression- ongoing issue for pt.  Was previously on 20mg  daily and weaned herself down to 5mg  which is the 'perfect dose'.  Will use xanax only as needed- 'visiting my mother at Christmas'.   Review of Systems For ROS see HPI     Objective:   Physical Exam  Constitutional: She is oriented to person, place, and time. She appears well-developed and well-nourished. No distress.  HENT:  Head: Normocephalic and atraumatic.  Eyes: Conjunctivae and EOM are normal. Pupils are equal, round, and reactive to light.  Neck: Normal range of motion. Neck supple. No thyromegaly present.  Cardiovascular: Normal rate, regular rhythm, normal heart sounds and intact distal pulses.   No murmur heard. Pulmonary/Chest: Effort normal and breath sounds normal. No respiratory distress.  Abdominal: Soft. She exhibits no distension. There is no tenderness.  Musculoskeletal: She exhibits no edema.  Lymphadenopathy:    She has no cervical adenopathy.  Neurological: She is alert and oriented to person, place, and time.  Skin: Skin is warm and dry.  Psychiatric: She has a normal mood and affect. Her behavior is normal.          Assessment & Plan:

## 2015-11-29 NOTE — Progress Notes (Signed)
Pre visit review using our clinic review tool, if applicable. No additional management support is needed unless otherwise documented below in the visit note. 

## 2015-11-29 NOTE — Patient Instructions (Signed)
Schedule your complete physical in 6 months No med changes at this time Keep up the good work on healthy diet and regular exercise- you look great! Call with any questions or concerns Happy Labor Day!!!

## 2015-11-30 NOTE — Assessment & Plan Note (Signed)
Chronic problem.  sxs are currently well controlled on Lexapro.  No changes at this time.  Will follow.

## 2015-11-30 NOTE — Assessment & Plan Note (Signed)
Chronic problem.  Pt reports good control w/ sxs w/ Carbidopa-Levodopa.  No augmentation and she has been on meds for over 25 yrs.  No changes at this time.  Will follow.

## 2015-11-30 NOTE — Assessment & Plan Note (Signed)
Chronic problem.  Currently well controlled on Metoprolol as long as she limits caffeine and stress.  No med changes.  Will follow.

## 2015-12-09 ENCOUNTER — Encounter: Payer: Self-pay | Admitting: Internal Medicine

## 2015-12-09 ENCOUNTER — Ambulatory Visit (INDEPENDENT_AMBULATORY_CARE_PROVIDER_SITE_OTHER): Payer: 59 | Admitting: Internal Medicine

## 2015-12-09 VITALS — BP 100/64 | HR 61 | Ht 65.0 in | Wt 169.0 lb

## 2015-12-09 DIAGNOSIS — G4733 Obstructive sleep apnea (adult) (pediatric): Secondary | ICD-10-CM | POA: Diagnosis not present

## 2015-12-09 DIAGNOSIS — G2581 Restless legs syndrome: Secondary | ICD-10-CM | POA: Diagnosis not present

## 2015-12-09 NOTE — Progress Notes (Signed)
09/27/2015-55 year old female never smoker-pt ref by Dr. Annye Asa. pt c/o loud snoring, restless sleep, daytime sleepiness, feels throat closes when laying flat. EPWORTH:17 Patient reports loud snoring and repeated waking during the night, feels her throat closing that she falls asleep, excessive daytime sleepiness next day. Avoids naps because she tends to wake with headache. ENT surgery-none. Denies lung disease. Occasional palpitation otherwise no heart trouble. Takes levodopa-carbidopa for diagnosed Restless Legs. Denies sleepwalking. Denies history of seizure disorder or family history of Parkinson's. Extensive cardiovascular disease history in the family.  12/09/2015-55 year old female never smoker followed for OSA Unattended Home Sleep Test-11/10/2015-severe obstructive sleep apnea-AHI 46.6/hour, desaturation to 81%, body weight 168 pounds FOLLOW FOR:  Sleep Study results. We discussed treatment goals and options. She is going to start with CPAP auto titration.  ROS-see HPI   Negative unless "+" Constitutional:    weight loss, night sweats, fevers, chills, fatigue, lassitude. HEENT:    headaches, difficulty swallowing, tooth/dental problems, sore throat,       sneezing, itching, ear ache, nasal congestion, post nasal drip, snoring CV:    chest pain, orthopnea, PND, swelling in lower extremities, anasarca,                                                     dizziness, palpitations Resp:   shortness of breath with exertion or at rest.                productive cough,   non-productive cough, coughing up of blood.              change in color of mucus.  wheezing.   Skin:    rash or lesions. GI:  No-   heartburn, indigestion, abdominal pain, nausea, vomiting, diarrhea,                 change in bowel habits, loss of appetite GU: dysuria, change in color of urine, no urgency or frequency.   flank pain. MS:   joint pain, stiffness, decreased range of motion, back pain. Neuro-      nothing unusual Psych:  change in mood or affect.  depression or anxiety.   memory loss.  OBJ- Physical Exam General- Alert, Oriented, Affect-appropriate, Distress- none acute Skin- rash-none, lesions- none, excoriation- none Lymphadenopathy- none Head- atraumatic            Eyes- Gross vision intact, PERRLA, conjunctivae and secretions clear            Ears- Hearing, canals-normal            Nose- Clear, no-Septal dev, mucus, polyps, erosion, perforation             Throat- Mallampati III-IV , mucosa clear , drainage- none, tonsils- atrophic Neck- flexible , trachea midline, no stridor , thyroid nl, carotid no bruit Chest - symmetrical excursion , unlabored           Heart/CV- RRR , no murmur , no gallop  , no rub, nl s1 s2                           - JVD- none , edema- none, stasis changes- none, varices- none           Lung- clear to P&A, wheeze- none, cough- none , dullness-none, rub- none  Chest wall-  Abd-  Br/ Gen/ Rectal- Not done, not indicated Extrem- cyanosis- none, clubbing, none, atrophy- none, strength- nl Neuro- grossly intact to observation

## 2015-12-09 NOTE — Patient Instructions (Signed)
Order- New DME, new CPAP auto 5-20, mask of choice, humidifier, supplies, AirView   Dx OSA  We will help you schedule a return to follow up here in 3 months   Please call as needed

## 2015-12-10 NOTE — Assessment & Plan Note (Signed)
She is on medication for this problem. At least part of her limb movement at night might be related to repeated respiratory arousals from sleep apnea, in which case successful treatment of OSA may help limb movement disorder as well.

## 2015-12-10 NOTE — Assessment & Plan Note (Signed)
CPAP is the best initial treatment for scores in her range. We discussed again the medical concerns of untreated sleep apnea and treatment options emphasizing CPAP. She will be looking for improved daytime sleepiness and prevention of snoring. Plan-start new CPAP with auto titration 5-20

## 2015-12-28 ENCOUNTER — Telehealth: Payer: Self-pay | Admitting: Family Medicine

## 2015-12-28 NOTE — Telephone Encounter (Signed)
I do not have any recent labs or physical notes on pt.  I will not be able to complete the form w/o some form of documentation that she had a PE w/ her previous provider.

## 2015-12-28 NOTE — Telephone Encounter (Signed)
Noted  

## 2015-12-28 NOTE — Telephone Encounter (Signed)
Pt states that she has a form for [facility] through her ins that needs to be completed, Pt is new to [facility] and has not had a cpe with her. Pt states that we should have all records for her and hoping that [facility] will be able to fill out form. Pt is emailing the form to me. As soon as I receive it I will give to [facility].

## 2015-12-28 NOTE — Telephone Encounter (Signed)
We do not have any notes on the patient, we may have to request them again.

## 2015-12-28 NOTE — Telephone Encounter (Signed)
Called pt making her aware that we do not have OV from past pcp. Pt states that she will have them fill out form and ask them to forward medical records to [place].

## 2016-02-01 ENCOUNTER — Telehealth: Payer: Self-pay | Admitting: Family Medicine

## 2016-02-01 NOTE — Telephone Encounter (Signed)
Pt is asking for a Rx for xanax, pt states that her and [facility] have talked about this in the past and hoping [facility] can do this without an appt, but if not pt will come in.

## 2016-02-02 LAB — HM MAMMOGRAPHY: HM Mammogram: NORMAL (ref 0–4)

## 2016-02-02 MED ORDER — ALPRAZOLAM 0.25 MG PO TABS: 0.2500 mg | ORAL_TABLET | Freq: Three times a day (TID) | ORAL | 0 refills | Status: DC

## 2016-02-02 NOTE — Telephone Encounter (Signed)
[place] for #60, no refills (to determine how often pt is using medication)

## 2016-02-02 NOTE — Telephone Encounter (Signed)
Noted med filled to pharmacy .

## 2016-02-02 NOTE — Telephone Encounter (Signed)
Last OV 11/29/15 Alprazolam last filled 08/10/15 (listed as historical 0.25mg  tid)

## 2016-03-03 ENCOUNTER — Telehealth: Payer: Self-pay | Admitting: Internal Medicine

## 2016-03-03 NOTE — Telephone Encounter (Signed)
Patient needs to reschedule visit to the week of 12/11 (but cannot make 12/12 visit) d/t insurance guidelines for cpap.  [facility] please advise if patient can be worked into schedule.  Thanks!

## 2016-03-06 NOTE — Telephone Encounter (Signed)
Pt can be seen in either RNA slots on Monday. Otherwise she can be seen by NP later in the week. Thanks.

## 2016-03-06 NOTE — Telephone Encounter (Signed)
lmtcb X1 to schedule appointment in below stated times.

## 2016-03-07 ENCOUNTER — Ambulatory Visit: Payer: 59 | Admitting: Internal Medicine

## 2016-03-08 NOTE — Telephone Encounter (Signed)
lmtcb x2 for pt. 

## 2016-03-09 NOTE — Telephone Encounter (Signed)
Pt wanted to be seen by [facility] for CPAP follow up-pt has been scheduled for Monday 03-13-16 at 4:30pm-pt to arrive at 4:15pm for check in. Nothing more needed at this time.

## 2016-03-13 ENCOUNTER — Ambulatory Visit (INDEPENDENT_AMBULATORY_CARE_PROVIDER_SITE_OTHER): Payer: 59 | Admitting: Internal Medicine

## 2016-03-13 ENCOUNTER — Encounter: Payer: Self-pay | Admitting: Internal Medicine

## 2016-03-13 DIAGNOSIS — G4733 Obstructive sleep apnea (adult) (pediatric): Secondary | ICD-10-CM

## 2016-03-13 DIAGNOSIS — G2581 Restless legs syndrome: Secondary | ICD-10-CM

## 2016-03-13 NOTE — Assessment & Plan Note (Signed)
Not reporting significant problems now. It remains to be seen whether this is still an issue after sleep quality and arousals are improved by CPAP therapy of OSA

## 2016-03-13 NOTE — Progress Notes (Signed)
09/27/2015-55 year old female never smoker-pt ref by Dr. Annye Asa. pt c/o loud snoring, restless sleep, daytime sleepiness, feels throat closes when laying flat. EPWORTH:17 Patient reports loud snoring and repeated waking during the night, feels her throat closing that she falls asleep, excessive daytime sleepiness next day. Avoids naps because she tends to wake with headache. ENT surgery-none. Denies lung disease. Occasional palpitation otherwise no heart trouble. Takes levodopa-carbidopa for diagnosed Restless Legs. Denies sleepwalking. Denies history of seizure disorder or family history of Parkinson's. Extensive cardiovascular disease history in the family.  12/09/2015-55 year old female never smoker followed for OSA Unattended Home Sleep Test-11/10/2015-severe obstructive sleep apnea-AHI 46.6/hour, desaturation to 81%, body weight 168 pounds FOLLOW FOR:  Sleep Study results. We discussed treatment goals and options. She is going to start with CPAP auto titration.  03/13/2016-55 year old female never smoker followed for OSA,  restless leg syndrome Follows for CPAP follow up.  CPAP auto 5-20/ [facility] Describes good start with no major discomforts. Download documents 83%/4 hour compliance, AHI 1.9/hour. Sleeping better  ROS-see HPI   Negative unless "+" Constitutional:    weight loss, night sweats, fevers, chills, fatigue, lassitude. HEENT:    headaches, difficulty swallowing, tooth/dental problems, sore throat,       sneezing, itching, ear ache, nasal congestion, post nasal drip, snoring CV:    chest pain, orthopnea, PND, swelling in lower extremities, anasarca,                                                     dizziness, palpitations Resp:   shortness of breath with exertion or at rest.                productive cough,   non-productive cough, coughing up of blood.              change in color of mucus.  wheezing.   Skin:    rash or lesions. GI:  No-   heartburn, indigestion,  abdominal pain, nausea, vomiting, diarrhea,                 change in bowel habits, loss of appetite GU: dysuria, change in color of urine, no urgency or frequency.   flank pain. MS:   joint pain, stiffness, decreased range of motion, back pain. Neuro-     nothing unusual Psych:  change in mood or affect.  depression or anxiety.   memory loss.  OBJ- Physical Exam General- Alert, Oriented, Affect-appropriate, Distress- none acute Skin- rash-none, lesions- none, excoriation- none Lymphadenopathy- none Head- atraumatic            Eyes- Gross vision intact, PERRLA, conjunctivae and secretions clear            Ears- Hearing, canals-normal            Nose- Clear, no-Septal dev, mucus, polyps, erosion, perforation             Throat- Mallampati III-IV , mucosa clear , drainage- none, tonsils- atrophic Neck- flexible , trachea midline, no stridor , thyroid nl, carotid no bruit Chest - symmetrical excursion , unlabored           Heart/CV- RRR , no murmur , no gallop  , no rub, nl s1 s2                           -  JVD- none , edema- none, stasis changes- none, varices- none           Lung- clear to P&A, wheeze- none, cough- none , dullness-none, rub- none           Chest wall-  Abd-  Br/ Gen/ Rectal- Not done, not indicated Extrem- cyanosis- none, clubbing, none, atrophy- none, strength- nl Neuro- grossly intact to observation

## 2016-03-13 NOTE — Assessment & Plan Note (Signed)
Good download report and she describes better sleep. Pressure sometimes gets a little too high and she takes the mask off. Plan-[facility] reduce auto Pap range to 5-15

## 2016-03-13 NOTE — Patient Instructions (Signed)
Order- DME [facility]   Please change auto range to 5-15, continue mask of choice, humidifier, supplies, AirView  Dx OSA  Please call as needed

## 2016-03-16 ENCOUNTER — Encounter: Payer: Self-pay | Admitting: Internal Medicine

## 2016-03-23 ENCOUNTER — Other Ambulatory Visit: Payer: Self-pay | Admitting: Family Medicine

## 2016-04-04 DIAGNOSIS — R05 Cough: Secondary | ICD-10-CM | POA: Diagnosis not present

## 2016-04-07 DIAGNOSIS — G4733 Obstructive sleep apnea (adult) (pediatric): Secondary | ICD-10-CM | POA: Diagnosis not present

## 2016-04-24 DIAGNOSIS — G4733 Obstructive sleep apnea (adult) (pediatric): Secondary | ICD-10-CM | POA: Diagnosis not present

## 2016-04-30 ENCOUNTER — Other Ambulatory Visit: Payer: Self-pay | Admitting: Family Medicine

## 2016-05-15 DIAGNOSIS — G4733 Obstructive sleep apnea (adult) (pediatric): Secondary | ICD-10-CM | POA: Diagnosis not present

## 2016-05-22 ENCOUNTER — Ambulatory Visit (INDEPENDENT_AMBULATORY_CARE_PROVIDER_SITE_OTHER): Payer: 59 | Admitting: Physician Assistant

## 2016-05-22 ENCOUNTER — Encounter: Payer: Self-pay | Admitting: Physician Assistant

## 2016-05-22 VITALS — BP 108/70 | HR 58 | Temp 98.7°F | Resp 14 | Ht 65.0 in | Wt 179.0 lb

## 2016-05-22 DIAGNOSIS — B9689 Other specified bacterial agents as the cause of diseases classified elsewhere: Secondary | ICD-10-CM

## 2016-05-22 DIAGNOSIS — J329 Chronic sinusitis, unspecified: Secondary | ICD-10-CM | POA: Diagnosis not present

## 2016-05-22 MED ORDER — DOXYCYCLINE HYCLATE 100 MG PO CAPS: 100.0000 mg | ORAL_CAPSULE | Freq: Two times a day (BID) | ORAL | 0 refills | Status: DC

## 2016-05-22 MED ORDER — FLUTICASONE PROPIONATE 50 MCG/ACT NA SUSP: 2.0000 | Freq: Every day | NASAL | 6 refills | Status: DC

## 2016-05-22 NOTE — Progress Notes (Signed)
Pre visit review using our clinic review tool, if applicable. No additional management support is needed unless otherwise documented below in the visit note. 

## 2016-05-22 NOTE — Progress Notes (Signed)
Patient presents to clinic today c/o cough, runny nose and frontal/sinus headache x 2-3 days. States that this morning when she woke up, the cough and headache were much worse, prompting her to come into the clinic. Describes the cough as painful, non-productive, and occurring approximately every 10 minutes. States the rhinorrhea is a clear mucous color, and describes the headache as dull, occurs off and on, and rates as a 3/10 severity. Additionally reports tinnitus that occurred once this morning, lasted a few minutes. Also notes maxillary sinus tenderness.  Denies chest pain, denies shortness of breath, palpitations or radiation of pain. Denies fever, chills, congestion, ear fullness, ear pair, dizziness or lightheadedness. States that she was visiting her grandchild this weekend, who was just diagnosed with bronchitis. Pt states she gets bronchitis every year, but painful cough is typically not as severe. Denies recent travel.    Past Medical History:  Diagnosis Date  . Anemia   . IBS (irritable bowel syndrome)   . Yeast infection     Current Outpatient Prescriptions on File Prior to Visit  Medication Sig Dispense Refill  . ALPRAZolam (XANAX) 0.25 MG tablet Take 1 tablet (0.25 mg total) by mouth 3 (three) times daily. 60 tablet 0  . carbidopa-levodopa (SINEMET IR) 25-100 MG tablet Take 1 tablet by mouth at bedtime. 90 tablet 3  . cetirizine (ZYRTEC) 10 MG tablet Take 10 mg by mouth daily.    Marland Kitchen escitalopram (LEXAPRO) 5 MG tablet Take 5 mg by mouth daily.    Marland Kitchen levonorgestrel (MIRENA) 20 MCG/24HR IUD 1 each by Intrauterine route once.    . meloxicam (MOBIC) 15 MG tablet TAKE 1 TABLET (15 MG TOTAL) BY MOUTH DAILY. 30 tablet 0  . metoprolol tartrate (LOPRESSOR) 25 MG tablet Take 12.5 mg by mouth daily.      No current facility-administered medications on file prior to visit.     Allergies  Allergen Reactions  . Penicillins     Has patient had a PCN reaction causing immediate rash,  facial/tongue/throat swelling, SOB or lightheadedness with hypotension: YES Has patient had a PCN reaction causing severe rash involving mucus membranes or skin necrosis: NO Has patient had a PCN reaction that required hospitalization NO Has patient had a PCN reaction occurring within the last 10 years: NO If all of the above answers are "NO", then may proceed with Cephalosporin use.  . Other     Fluoroquinolone--- Rash     Family History  Problem Relation Age of Onset  . Colon cancer Maternal Grandfather   . Heart disease Father   . Stroke Father   . Hypertension Father   . Hyperlipidemia Father   . Heart attack Father   . Heart disease Brother   . Stroke Brother   . Hypertension Brother   . Diverticulitis Mother   . Alcohol abuse Brother     Social History   Social History  . Marital status: Unknown    Spouse name: N/A  . Number of children: N/A  . Years of education: N/A   Social History Main Topics  . Smoking status: Never Smoker  . Smokeless tobacco: Never Used  . Alcohol use No  . Drug use: No  . Sexual activity: Not Currently    Birth control/ protection: IUD     Comment: MIRENA inserted 09-20-09   Other Topics Concern  . None   Social History Narrative  . None    Review of Systems - See HPI.  All other ROS  are negative.  BP 108/70   Pulse (!) 58   Temp 98.7 F (37.1 C) (Oral)   Resp 14   Ht 5\' 5"  (1.651 m)   Wt 179 lb (81.2 kg)   SpO2 98%   BMI 29.79 kg/m   Physical Exam  Constitutional: She is well-developed, well-nourished, and in no distress. No distress.  HENT:  Right Ear: External ear and ear canal normal. No drainage or tenderness. Tympanic membrane is not perforated and not erythematous. A middle ear effusion (more fluid noticed in right ear) is present.  Left Ear: External ear and ear canal normal. No drainage or tenderness. Tympanic membrane is not perforated and not erythematous. A middle ear effusion is present.  Nose: Mucosal edema  present. No sinus tenderness or nasal deformity. Right sinus exhibits maxillary sinus tenderness. Left sinus exhibits maxillary sinus tenderness (more tenderness noted in left maxillary sinus).  Mouth/Throat: Posterior oropharyngeal erythema present. No oropharyngeal exudate, posterior oropharyngeal edema or tonsillar abscesses.  Eyes: Right conjunctiva is injected. Left conjunctiva is injected.  Cardiovascular: Normal rate, regular rhythm and normal heart sounds.  Exam reveals no gallop and no friction rub.   No murmur heard. Pulmonary/Chest: Effort normal and breath sounds normal. No respiratory distress. She has no wheezes. She has no rales. She exhibits no tenderness.  Lymphadenopathy:       Head (right side): No submental, no submandibular, no tonsillar, no preauricular, no posterior auricular and no occipital adenopathy present.       Head (left side): Submental and submandibular adenopathy present. No tonsillar, no preauricular, no posterior auricular and no occipital adenopathy present.    She has no cervical adenopathy.       Right: No supraclavicular adenopathy present.       Left: No supraclavicular adenopathy present.  Skin: Skin is warm and dry. She is not diaphoretic.  Psychiatric: Affect normal.   Assessment/Plan: 1. Bacterial sinusitis - Begin Rx Doxycycline (VIBRAMYCIN) 100 MG capsule; Take 1 capsule (100 mg total) by mouth 2 (two) times daily.  Dispense: 20 capsule; Refill: 0 - Instructed pt to take doxycycline with food - Instructed patient to use intranasal Flonase as needed for congestion/rhinorrhea, and Mucinex DM/Delsym as needed for cough. Continue daily Zyrtec. - Educated patient on importance of rest and hydration. Saline nasal rinse recommended. Humidifier in bedroom.     Leeanne Rio, PA-C

## 2016-05-22 NOTE — Patient Instructions (Signed)
Please take antibiotic as directed.  Increase fluid intake.  Use Saline nasal spray.  Take a daily multivitamin. Start Flonase as directed. OTC Mucinex-DM for cough.  Place a humidifier in the bedroom.  Please call or return clinic if symptoms are not improving.  Sinusitis Sinusitis is redness, soreness, and swelling (inflammation) of the paranasal sinuses. Paranasal sinuses are air pockets within the bones of your face (beneath the eyes, the middle of the forehead, or above the eyes). In healthy paranasal sinuses, mucus is able to drain out, and air is able to circulate through them by way of your nose. However, when your paranasal sinuses are inflamed, mucus and air can become trapped. This can allow bacteria and other germs to grow and cause infection. Sinusitis can develop quickly and last only a short time (acute) or continue over a long period (chronic). Sinusitis that lasts for more than 12 weeks is considered chronic.  CAUSES  Causes of sinusitis include:  Allergies.  Structural abnormalities, such as displacement of the cartilage that separates your nostrils (deviated septum), which can decrease the air flow through your nose and sinuses and affect sinus drainage.  Functional abnormalities, such as when the small hairs (cilia) that line your sinuses and help remove mucus do not work properly or are not present. SYMPTOMS  Symptoms of acute and chronic sinusitis are the same. The primary symptoms are pain and pressure around the affected sinuses. Other symptoms include:  Upper toothache.  Earache.  Headache.  Bad breath.  Decreased sense of smell and taste.  A cough, which worsens when you are lying flat.  Fatigue.  Fever.  Thick drainage from your nose, which often is green and may contain pus (purulent).  Swelling and warmth over the affected sinuses. DIAGNOSIS  Your caregiver will perform a physical exam. During the exam, your caregiver may:  Look in your nose for  signs of abnormal growths in your nostrils (nasal polyps).  Tap over the affected sinus to check for signs of infection.  View the inside of your sinuses (endoscopy) with a special imaging device with a light attached (endoscope), which is inserted into your sinuses. If your caregiver suspects that you have chronic sinusitis, one or more of the following tests may be recommended:  Allergy tests.  Nasal culture A sample of mucus is taken from your nose and sent to a lab and screened for bacteria.  Nasal cytology A sample of mucus is taken from your nose and examined by your caregiver to determine if your sinusitis is related to an allergy. TREATMENT  Most cases of acute sinusitis are related to a viral infection and will resolve on their own within 10 days. Sometimes medicines are prescribed to help relieve symptoms (pain medicine, decongestants, nasal steroid sprays, or saline sprays).  However, for sinusitis related to a bacterial infection, your caregiver will prescribe antibiotic medicines. These are medicines that will help kill the bacteria causing the infection.  Rarely, sinusitis is caused by a fungal infection. In theses cases, your caregiver will prescribe antifungal medicine. For some cases of chronic sinusitis, surgery is needed. Generally, these are cases in which sinusitis recurs more than 3 times per year, despite other treatments. HOME CARE INSTRUCTIONS   Drink plenty of water. Water helps thin the mucus so your sinuses can drain more easily.  Use a humidifier.  Inhale steam 3 to 4 times a day (for example, sit in the bathroom with the shower running).  Apply a warm, moist washcloth to  your face 3 to 4 times a day, or as directed by your caregiver.  Use saline nasal sprays to help moisten and clean your sinuses.  Take over-the-counter or prescription medicines for pain, discomfort, or fever only as directed by your caregiver. SEEK IMMEDIATE MEDICAL CARE IF:  You have  increasing pain or severe headaches.  You have nausea, vomiting, or drowsiness.  You have swelling around your face.  You have vision problems.  You have a stiff neck.  You have difficulty breathing. MAKE SURE YOU:   Understand these instructions.  Will watch your condition.  Will get help right away if you are not doing well or get worse. Document Released: 03/20/2005 Document Revised: 06/12/2011 Document Reviewed: 04/04/2011 [facility] Patient Information 2014 [place], [place].

## 2016-05-25 DIAGNOSIS — L814 Other melanin hyperpigmentation: Secondary | ICD-10-CM | POA: Diagnosis not present

## 2016-05-25 DIAGNOSIS — G4733 Obstructive sleep apnea (adult) (pediatric): Secondary | ICD-10-CM | POA: Diagnosis not present

## 2016-05-25 DIAGNOSIS — L820 Inflamed seborrheic keratosis: Secondary | ICD-10-CM | POA: Diagnosis not present

## 2016-06-01 ENCOUNTER — Encounter: Payer: Self-pay | Admitting: Family Medicine

## 2016-06-01 ENCOUNTER — Other Ambulatory Visit: Payer: Self-pay | Admitting: Family Medicine

## 2016-06-01 ENCOUNTER — Ambulatory Visit (INDEPENDENT_AMBULATORY_CARE_PROVIDER_SITE_OTHER): Payer: 59 | Admitting: Family Medicine

## 2016-06-01 DIAGNOSIS — Z0000 Encounter for general adult medical examination without abnormal findings: Secondary | ICD-10-CM

## 2016-06-01 LAB — BASIC METABOLIC PANEL
BUN: 15 mg/dL (ref 6–23)
CHLORIDE: 104 meq/L (ref 96–112)
CO2: 30 mEq/L (ref 19–32)
CREATININE: 0.64 mg/dL (ref 0.40–1.20)
Calcium: 9.7 mg/dL (ref 8.4–10.5)
GFR: 102.86 mL/min (ref >60.00)
Glucose, Bld: 94 mg/dL (ref 70–99)
Potassium: 4.9 mEq/L (ref 3.5–5.1)
Sodium: 138 mEq/L (ref 135–145)

## 2016-06-01 LAB — CBC WITH DIFFERENTIAL/PLATELET
BASOS ABS: 0 10*3/uL (ref 0.0–0.1)
Basophils Relative: 0.7 % (ref 0.0–3.0)
EOS ABS: 0.1 10*3/uL (ref 0.0–0.7)
EOS PCT: 2 % (ref 0.0–5.0)
HCT: 40.4 % (ref 36.0–46.0)
Hemoglobin: 13.3 g/dL (ref 12.0–15.0)
LYMPHS ABS: 2.4 10*3/uL (ref 0.7–4.0)
Lymphocytes Relative: 41.1 % (ref 12.0–46.0)
MCHC: 32.9 g/dL (ref 30.0–36.0)
MCV: 90.9 fl (ref 78.0–100.0)
MONO ABS: 0.4 10*3/uL (ref 0.1–1.0)
Monocytes Relative: 5.9 % (ref 3.0–12.0)
NEUTROS PCT: 50.3 % (ref 43.0–77.0)
Neutro Abs: 3 10*3/uL (ref 1.4–7.7)
Platelets: 450 10*3/uL — ABNORMAL HIGH (ref 150.0–400.0)
RBC: 4.44 Mil/uL (ref 3.87–5.11)
RDW: 13.7 % (ref 11.5–15.5)
WBC: 5.9 10*3/uL (ref 4.0–10.5)

## 2016-06-01 LAB — HEPATIC FUNCTION PANEL
ALBUMIN: 4.3 g/dL (ref 3.5–5.2)
ALT: 20 U/L (ref 0–35)
AST: 14 U/L (ref 0–37)
Alkaline Phosphatase: 80 U/L (ref 39–117)
Bilirubin, Direct: 0.1 mg/dL (ref 0.0–0.3)
TOTAL PROTEIN: 7 g/dL (ref 6.0–8.3)
Total Bilirubin: 0.5 mg/dL (ref 0.2–1.2)

## 2016-06-01 LAB — VITAMIN D 25 HYDROXY (VIT D DEFICIENCY, FRACTURES): VITD: 57.09 ng/mL (ref 30.00–100.00)

## 2016-06-01 LAB — LIPID PANEL
CHOL/HDL RATIO: 5
Cholesterol: 209 mg/dL — ABNORMAL HIGH (ref 0–200)
HDL: 41.4 mg/dL (ref >39.00)
LDL Cholesterol: 134 mg/dL — ABNORMAL HIGH (ref 0–99)
NonHDL: 167.93
TRIGLYCERIDES: 170 mg/dL — AB (ref 0.0–149.0)
VLDL: 34 mg/dL (ref 0.0–40.0)

## 2016-06-01 LAB — TSH: TSH: 1.63 u[IU]/mL (ref 0.35–4.50)

## 2016-06-01 MED ORDER — ALPRAZOLAM 0.25 MG PO TABS: 0.2500 mg | ORAL_TABLET | Freq: Three times a day (TID) | ORAL | 0 refills | Status: DC

## 2016-06-01 NOTE — Progress Notes (Signed)
Pre visit review using our clinic review tool, if applicable. No additional management support is needed unless otherwise documented below in the visit note. 

## 2016-06-01 NOTE — Progress Notes (Signed)
   Subjective:    Patient ID: Sandra Benitez, female    DOB: 1960-11-04, 56 y.o.   MRN: [id number]  HPI CPE- UTD on colonoscopy, pap smear, mammo (done Oct/November- w/ Dr Cletis Media).  Down 4 lbs since 2/19- now on [employer].     Review of Systems Patient reports no vision/ hearing changes, adenopathy,fever, weight change,  persistant/recurrent hoarseness , swallowing issues, chest pain, palpitations, edema, persistant/recurrent cough, hemoptysis, dyspnea (rest/exertional/paroxysmal nocturnal), gastrointestinal bleeding (melena, rectal bleeding), abdominal pain, significant heartburn, bowel changes, GU symptoms (dysuria, hematuria, incontinence), Gyn symptoms (abnormal  bleeding, pain),  syncope, focal weakness, memory loss, numbness & tingling, skin/hair/nail changes, abnormal bruising or bleeding, anxiety, or depression.     Objective:   Physical Exam General Appearance:    Alert, cooperative, no distress, appears stated age  Head:    Normocephalic, without obvious abnormality, atraumatic  Eyes:    PERRL, conjunctiva/corneas clear, EOM's intact, fundi    benign, both eyes  Ears:    Normal TM's and external ear canals, both ears  Nose:   Nares normal, septum midline, mucosa normal, no drainage    or sinus tenderness  Throat:   Lips, mucosa, and tongue normal; teeth and gums normal  Neck:   Supple, symmetrical, trachea midline, no adenopathy;    Thyroid: no enlargement/tenderness/nodules  Back:     Symmetric, no curvature, ROM normal, no CVA tenderness  Lungs:     Clear to auscultation bilaterally, respirations unlabored  Chest Wall:    No tenderness or deformity   Heart:    Regular rate and rhythm, S1 and S2 normal, no murmur, rub   or gallop  Breast Exam:    Deferred to GYN  Abdomen:     Soft, non-tender, bowel sounds active all four quadrants,    no masses, no organomegaly  Genitalia:    Deferred to GYN  Rectal:    Extremities:   Extremities normal, atraumatic, no cyanosis or edema    Pulses:   2+ and symmetric all extremities  Skin:   Skin color, texture, turgor normal, no rashes or lesions  Lymph nodes:   Cervical, supraclavicular, and axillary nodes normal  Neurologic:   CNII-XII intact, normal strength, sensation and reflexes    throughout          Assessment & Plan:

## 2016-06-01 NOTE — Patient Instructions (Signed)
Follow up in 1 year or as needed We'll notify you of your lab results and make any changes if needed Keep up the good work on healthy diet and regular exercise- you look great!!! Call with any questions or concerns [place]!!! Sandra Benitez in [place]!!!

## 2016-06-01 NOTE — Assessment & Plan Note (Signed)
Pt's PE WNL.  UTD on GYN and colonoscopy.  Applauded her weight loss efforts.  Check labs.  Anticipatory guidance provided.

## 2016-06-13 ENCOUNTER — Encounter: Payer: Self-pay | Admitting: Physician Assistant

## 2016-06-13 ENCOUNTER — Telehealth: Payer: Self-pay | Admitting: Family Medicine

## 2016-06-13 ENCOUNTER — Ambulatory Visit (INDEPENDENT_AMBULATORY_CARE_PROVIDER_SITE_OTHER): Payer: 59 | Admitting: Physician Assistant

## 2016-06-13 VITALS — BP 110/64 | HR 72 | Temp 98.9°F | Resp 14 | Ht 65.0 in | Wt 169.0 lb

## 2016-06-13 DIAGNOSIS — J329 Chronic sinusitis, unspecified: Secondary | ICD-10-CM

## 2016-06-13 MED ORDER — SULFAMETHOXAZOLE-TRIMETHOPRIM 800-160 MG PO TABS: 1.0000 | ORAL_TABLET | Freq: Two times a day (BID) | ORAL | 0 refills | Status: DC

## 2016-06-13 MED ORDER — ALBUTEROL SULFATE HFA 108 (90 BASE) MCG/ACT IN AERS: 2.0000 | INHALATION_SPRAY | Freq: Four times a day (QID) | RESPIRATORY_TRACT | 0 refills | Status: DC | PRN

## 2016-06-13 MED ORDER — BENZONATATE 100 MG PO CAPS: 100.0000 mg | ORAL_CAPSULE | Freq: Two times a day (BID) | ORAL | 0 refills | Status: DC | PRN

## 2016-06-13 NOTE — Progress Notes (Signed)
Pt presents to clinic today c/o cough x1 month. Pt visited office on 05/22/2016 - dx with bacterial sinusitis, given Doxycycline. Pt endorses finishing course of abx. Pt states that cough has not gone away since; states cough was dry initially, but has become productive (clear with hint of yellow) within the last 3-4 days. Reports cough is only productive in the morning after awakening. Admits right ear pain (x2 days ago) and right sinus pressure (right sided x 2 days ago), and runny nose. Denies SOB, states she "gets a little wheezy after a coughing spell." Tried Mucinex and cough drops with relief. Denies coughing after eating, heartburn symptoms, dyspepsia, dysphagia. Denies tinnitus, ear pressure, constipation, hematochezia, headaches, fevers. Denies recent travel, sick contacts.   Past Medical History:  Diagnosis Date  . Anemia   . IBS (irritable bowel syndrome)   . Yeast infection     Current Outpatient Prescriptions on File Prior to Visit  Medication Sig Dispense Refill  . ALPRAZolam (XANAX) 0.25 MG tablet Take 1 tablet (0.25 mg total) by mouth 3 (three) times daily. 60 tablet 0  . bimatoprost (LATISSE) 0.03 % ophthalmic solution APPLY TO AFFECTED EYELIDS QHS.  3  . carbidopa-levodopa (SINEMET IR) 25-100 MG tablet Take 1 tablet by mouth at bedtime. 90 tablet 3  . cetirizine (ZYRTEC) 10 MG tablet Take 10 mg by mouth daily.    Marland Kitchen escitalopram (LEXAPRO) 5 MG tablet Take 5 mg by mouth daily.    . fluticasone (FLONASE) 50 MCG/ACT nasal spray Place 2 sprays into both nostrils daily. 16 g 6  . levonorgestrel (MIRENA) 20 MCG/24HR IUD 1 each by Intrauterine route once.    . meloxicam (MOBIC) 15 MG tablet TAKE 1 TABLET (15 MG TOTAL) BY MOUTH DAILY. 30 tablet 0  . metoprolol tartrate (LOPRESSOR) 25 MG tablet Take 12.5 mg by mouth daily.      No current facility-administered medications on file prior to visit.     Allergies  Allergen Reactions  . Penicillins     Has patient had a PCN  reaction causing immediate rash, facial/tongue/throat swelling, SOB or lightheadedness with hypotension: YES Has patient had a PCN reaction causing severe rash involving mucus membranes or skin necrosis: NO Has patient had a PCN reaction that required hospitalization NO Has patient had a PCN reaction occurring within the last 10 years: NO If all of the above answers are "NO", then may proceed with Cephalosporin use.  . Other     Fluoroquinolone--- Rash     Family History  Problem Relation Age of Onset  . Colon cancer Maternal Grandfather   . Heart disease Father   . Stroke Father   . Hypertension Father   . Hyperlipidemia Father   . Heart attack Father   . Heart disease Brother   . Stroke Brother   . Hypertension Brother   . Diverticulitis Mother   . Alcohol abuse Brother     Social History   Social History  . Marital status: Unknown    Spouse name: N/A  . Number of children: N/A  . Years of education: N/A   Social History Main Topics  . Smoking status: Never Smoker  . Smokeless tobacco: Never Used  . Alcohol use Yes     Comment: Occasional  . Drug use: No  . Sexual activity: Not Currently    Birth control/ protection: IUD     Comment: MIRENA inserted 09-20-09   Other Topics Concern  . None   Social History Narrative  .  None   Review of Systems - See HPI.  All other ROS are negative.  BP 110/64   Pulse 72   Temp 98.9 F (37.2 C) (Oral)   Resp 14   Ht 5\' 5"  (1.651 m)   Wt 169 lb (76.7 kg)   SpO2 98%   BMI 28.12 kg/m   Physical Exam  Constitutional: She is well-developed, well-nourished, and in no distress. No distress.  HENT:  Right Ear: External ear normal. Tympanic membrane is not erythematous. A middle ear effusion (serous) is present.  Left Ear: External ear normal. Tympanic membrane is not erythematous.  No middle ear effusion.  Nose: Mucosal edema (with erythema) present. Right sinus exhibits maxillary sinus tenderness and frontal sinus  tenderness. Left sinus exhibits no maxillary sinus tenderness and no frontal sinus tenderness.  Mouth/Throat: Oropharynx is clear and moist. No oropharyngeal exudate, posterior oropharyngeal edema or posterior oropharyngeal erythema.  Eyes: Pupils are equal, round, and reactive to light. Right conjunctiva is injected.  Cardiovascular: Normal rate, regular rhythm and normal heart sounds.  Exam reveals no gallop and no friction rub.   No murmur heard. Pulmonary/Chest: Effort normal. No respiratory distress. She has no decreased breath sounds. She has wheezes in the left lower field. She has no rhonchi. She has no rales.  Lymphadenopathy:       Head (right side): Submandibular adenopathy present. No submental, no tonsillar, no preauricular, no posterior auricular and no occipital adenopathy present.       Head (left side): No submental, no submandibular, no tonsillar, no preauricular, no posterior auricular and no occipital adenopathy present.    She has no cervical adenopathy.  Skin: She is not diaphoretic.    Recent Results (from the past 2160 hour(s))  Lipid panel     Status: Abnormal   Collection Time: 06/01/16 10:25 AM  Result Value Ref Range   Cholesterol 209 (H) 0 - 200 mg/dL    Comment: ATP III Classification       Desirable:  < 200 mg/dL               Borderline High:  200 - 239 mg/dL          High:  > = 240 mg/dL   Triglycerides 170.0 (H) 0.0 - 149.0 mg/dL    Comment: Normal:  <150 mg/dLBorderline High:  150 - 199 mg/dL   HDL 41.40 >39.00 mg/dL   VLDL 34.0 0.0 - 40.0 mg/dL   LDL Cholesterol 134 (H) 0 - 99 mg/dL   Total CHOL/HDL Ratio 5     Comment:                Men          Women1/2 Average Risk     3.4          3.3Average Risk          5.0          4.42X Average Risk          9.6          7.13X Average Risk          15.0          11.0                       NonHDL 167.93     Comment: NOTE:  Non-HDL goal should be 30 mg/dL higher than patient's LDL goal (i.e. LDL goal of < 70  mg/dL, would  have non-HDL goal of < 100 mg/dL)  Basic metabolic panel     Status: None   Collection Time: 06/01/16 10:25 AM  Result Value Ref Range   Sodium 138 135 - 145 mEq/L   Potassium 4.9 3.5 - 5.1 mEq/L   Chloride 104 96 - 112 mEq/L   CO2 30 19 - 32 mEq/L   Glucose, Bld 94 70 - 99 mg/dL   BUN 15 6 - 23 mg/dL   Creatinine, Ser 0.64 0.40 - 1.20 mg/dL   Calcium 9.7 8.4 - 10.5 mg/dL   GFR 102.86 >60.00 mL/min  TSH     Status: None   Collection Time: 06/01/16 10:25 AM  Result Value Ref Range   TSH 1.63 0.35 - 4.50 uIU/mL  Hepatic function panel     Status: None   Collection Time: 06/01/16 10:25 AM  Result Value Ref Range   Total Bilirubin 0.5 0.2 - 1.2 mg/dL   Bilirubin, Direct 0.1 0.0 - 0.3 mg/dL   Alkaline Phosphatase 80 39 - 117 U/L   AST 14 0 - 37 U/L   ALT 20 0 - 35 U/L   Total Protein 7.0 6.0 - 8.3 g/dL   Albumin 4.3 3.5 - 5.2 g/dL  VITAMIN D 25 Hydroxy (Vit-D Deficiency, Fractures)     Status: None   Collection Time: 06/01/16 10:25 AM  Result Value Ref Range   VITD 57.09 30.00 - 100.00 ng/mL  CBC with Differential/Platelet     Status: Abnormal   Collection Time: 06/01/16 10:25 AM  Result Value Ref Range   WBC 5.9 4.0 - 10.5 K/uL   RBC 4.44 3.87 - 5.11 Mil/uL   Hemoglobin 13.3 12.0 - 15.0 g/dL   HCT 40.4 36.0 - 46.0 %   MCV 90.9 78.0 - 100.0 fl   MCHC 32.9 30.0 - 36.0 g/dL   RDW 13.7 11.5 - 15.5 %   Platelets 450.0 (H) 150.0 - 400.0 K/uL   Neutrophils Relative % 50.3 43.0 - 77.0 %   Lymphocytes Relative 41.1 12.0 - 46.0 %   Monocytes Relative 5.9 3.0 - 12.0 %   Eosinophils Relative 2.0 0.0 - 5.0 %   Basophils Relative 0.7 0.0 - 3.0 %   Neutro Abs 3.0 1.4 - 7.7 K/uL   Lymphs Abs 2.4 0.7 - 4.0 K/uL   Monocytes Absolute 0.4 0.1 - 1.0 K/uL   Eosinophils Absolute 0.1 0.0 - 0.7 K/uL   Basophils Absolute 0.0 0.0 - 0.1 K/uL   Assessment/Plan: 1. Recurrent sinusitis Rx Bactrim. Supportive measures and OTC medications reviewed. Rx albuterol and Tessalon. -  sulfamethoxazole-trimethoprim (BACTRIM DS,SEPTRA DS) 800-160 MG tablet; Take 1 tablet by mouth 2 (two) times daily.  Dispense: 14 tablet; Refill: 0   Leeanne Rio, [place]

## 2016-06-13 NOTE — Telephone Encounter (Signed)
FYI

## 2016-06-13 NOTE — Telephone Encounter (Signed)
Patient Name: Sandra Benitez  DOB: 1960-12-28    Initial Comment Caller states was in couple of weeks ago w/bronchitis; cough is worse; Wheezy at times after the cough;    Nurse Assessment  Nurse: Julien Girt, RN, Almyra Free Date/Time Eilene Ghazi Time): 06/13/2016 10:11:54 AM  Confirm and document reason for call. If symptomatic, describe symptoms. ---Caller states she was in on February 19th, and dx w/bronchitis. States her cough is very annoying and she is wheezing at the end of coughing spasms now.  Does the patient have any new or worsening symptoms? ---Yes  Will a triage be completed? ---Yes  Related visit to physician within the last 2 weeks? ---No  Does the PT have any chronic conditions? (i.e. diabetes, asthma, etc.) ---Yes  List chronic conditions. ---RLS  Is the patient pregnant or possibly pregnant? (Ask all females between the ages of 61-55) ---No  Is this a behavioral health or substance abuse call? ---No     Guidelines    Guideline Title Affirmed Question Affirmed Notes  Cough - Acute Productive Wheezing is present    Final Disposition User   See Physician within 4 Hours (or PCP triage) Julien Girt, RN, Almyra Free    Referrals  REFERRED TO PCP OFFICE   Disagree/Comply: Leta Baptist

## 2016-06-13 NOTE — Progress Notes (Signed)
Pre visit review using our clinic review tool, if applicable. No additional management support is needed unless otherwise documented below in the visit note. 

## 2016-06-13 NOTE — Patient Instructions (Signed)
Please take antibiotic as directed.  Increase fluid intake.  Use Saline nasal spray.  Take a daily multivitamin. Use the cough medication and inhaler as directed (inhaler if needed for chest tightness or wheezing).  Place a humidifier in the bedroom.  Please call or return clinic if symptoms are not improving.  Sinusitis Sinusitis is redness, soreness, and swelling (inflammation) of the paranasal sinuses. Paranasal sinuses are air pockets within the bones of your face (beneath the eyes, the middle of the forehead, or above the eyes). In healthy paranasal sinuses, mucus is able to drain out, and air is able to circulate through them by way of your nose. However, when your paranasal sinuses are inflamed, mucus and air can become trapped. This can allow bacteria and other germs to grow and cause infection. Sinusitis can develop quickly and last only a short time (acute) or continue over a long period (chronic). Sinusitis that lasts for more than 12 weeks is considered chronic.  CAUSES  Causes of sinusitis include:  Allergies.  Structural abnormalities, such as displacement of the cartilage that separates your nostrils (deviated septum), which can decrease the air flow through your nose and sinuses and affect sinus drainage.  Functional abnormalities, such as when the small hairs (cilia) that line your sinuses and help remove mucus do not work properly or are not present. SYMPTOMS  Symptoms of acute and chronic sinusitis are the same. The primary symptoms are pain and pressure around the affected sinuses. Other symptoms include:  Upper toothache.  Earache.  Headache.  Bad breath.  Decreased sense of smell and taste.  A cough, which worsens when you are lying flat.  Fatigue.  Fever.  Thick drainage from your nose, which often is green and may contain pus (purulent).  Swelling and warmth over the affected sinuses. DIAGNOSIS  Your caregiver will perform a physical exam. During the exam,  your caregiver may:  Look in your nose for signs of abnormal growths in your nostrils (nasal polyps).  Tap over the affected sinus to check for signs of infection.  View the inside of your sinuses (endoscopy) with a special imaging device with a light attached (endoscope), which is inserted into your sinuses. If your caregiver suspects that you have chronic sinusitis, one or more of the following tests may be recommended:  Allergy tests.  Nasal culture A sample of mucus is taken from your nose and sent to a lab and screened for bacteria.  Nasal cytology A sample of mucus is taken from your nose and examined by your caregiver to determine if your sinusitis is related to an allergy. TREATMENT  Most cases of acute sinusitis are related to a viral infection and will resolve on their own within 10 days. Sometimes medicines are prescribed to help relieve symptoms (pain medicine, decongestants, nasal steroid sprays, or saline sprays).  However, for sinusitis related to a bacterial infection, your caregiver will prescribe antibiotic medicines. These are medicines that will help kill the bacteria causing the infection.  Rarely, sinusitis is caused by a fungal infection. In theses cases, your caregiver will prescribe antifungal medicine. For some cases of chronic sinusitis, surgery is needed. Generally, these are cases in which sinusitis recurs more than 3 times per year, despite other treatments. HOME CARE INSTRUCTIONS   Drink plenty of water. Water helps thin the mucus so your sinuses can drain more easily.  Use a humidifier.  Inhale steam 3 to 4 times a day (for example, sit in the bathroom with the shower  running).  Apply a warm, moist washcloth to your face 3 to 4 times a day, or as directed by your caregiver.  Use saline nasal sprays to help moisten and clean your sinuses.  Take over-the-counter or prescription medicines for pain, discomfort, or fever only as directed by your  caregiver. SEEK IMMEDIATE MEDICAL CARE IF:  You have increasing pain or severe headaches.  You have nausea, vomiting, or drowsiness.  You have swelling around your face.  You have vision problems.  You have a stiff neck.  You have difficulty breathing. MAKE SURE YOU:   Understand these instructions.  Will watch your condition.  Will get help right away if you are not doing well or get worse. Document Released: 03/20/2005 Document Revised: 06/12/2011 Document Reviewed: 04/04/2011 [facility] Patient Information 2014 [place], [place].

## 2016-06-21 DIAGNOSIS — G4733 Obstructive sleep apnea (adult) (pediatric): Secondary | ICD-10-CM | POA: Diagnosis not present

## 2016-06-22 DIAGNOSIS — G4733 Obstructive sleep apnea (adult) (pediatric): Secondary | ICD-10-CM | POA: Diagnosis not present

## 2016-06-26 ENCOUNTER — Encounter: Payer: Self-pay | Admitting: Family Medicine

## 2016-06-26 ENCOUNTER — Ambulatory Visit (INDEPENDENT_AMBULATORY_CARE_PROVIDER_SITE_OTHER): Payer: 59 | Admitting: Family Medicine

## 2016-06-26 VITALS — BP 116/72 | HR 81 | Temp 98.9°F | Resp 18 | Ht 65.0 in | Wt 176.1 lb

## 2016-06-26 DIAGNOSIS — R058 Other specified cough: Secondary | ICD-10-CM

## 2016-06-26 DIAGNOSIS — R05 Cough: Secondary | ICD-10-CM

## 2016-06-26 MED ORDER — PREDNISONE 10 MG PO TABS
ORAL_TABLET | ORAL | 0 refills | Status: DC
Start: 2016-06-26 — End: 2016-07-31

## 2016-06-26 NOTE — Progress Notes (Signed)
   Subjective:    Patient ID: Sandra Benitez, female    DOB: October 03, 1960, 56 y.o.   MRN: [id number]  HPI Cough- pt reports she has had cough since Feb 19th.  2 rounds of abx.  No fevers since the first week of illness.  Some wheezing but not using albuterol inhaler.  Using Zyrtec and Flonase.  No sinus pain/pressure.  No N/V.  No ear pain.  Pt reports other than cough, she is feeling mostly well.   Review of Systems For ROS see HPI     Objective:   Physical Exam  Constitutional: She is oriented to person, place, and time. She appears well-developed and well-nourished. No distress.  HENT:  Head: Normocephalic and atraumatic.  TMs normal bilaterally Mild nasal congestion Throat w/out erythema, edema, or exudate  Eyes: Conjunctivae and EOM are normal. Pupils are equal, round, and reactive to light.  Neck: Normal range of motion. Neck supple.  Cardiovascular: Normal rate, regular rhythm, normal heart sounds and intact distal pulses.   No murmur heard. Pulmonary/Chest: Effort normal and breath sounds normal. No respiratory distress. She has no wheezes.  + hacking cough  Lymphadenopathy:    She has no cervical adenopathy.  Neurological: She is alert and oriented to person, place, and time.  Skin: Skin is warm and dry.  Psychiatric: She has a normal mood and affect. Her behavior is normal. Thought content normal.  Vitals reviewed.         Assessment & Plan:  Post-viral cough- pt's duration of cough and the fact that she's overall feeling well is consistent w/ post-infectious cough.  No evidence of bacterial infxn.  No need for 3rd round of abx.  Reviewed supportive care and red flags that should prompt return.  Pt expressed understanding and is in agreement w/ plan.

## 2016-06-26 NOTE — Patient Instructions (Signed)
Follow up as needed/scheduled Start the Prednisone as directed- take all 3 pills at the same time x3 days, and then decrease as directed.  Take w/ food Drink plenty of fluids REST!!!! Call with any questions or concerns [place]!!!

## 2016-06-26 NOTE — Progress Notes (Signed)
Pre visit review using our clinic review tool, if applicable. No additional management support is needed unless otherwise documented below in the visit note. 

## 2016-07-05 ENCOUNTER — Telehealth: Payer: Self-pay | Admitting: Family Medicine

## 2016-07-05 DIAGNOSIS — R05 Cough: Secondary | ICD-10-CM

## 2016-07-05 DIAGNOSIS — R053 Chronic cough: Secondary | ICD-10-CM

## 2016-07-05 NOTE — Telephone Encounter (Signed)
Pt states that she has been seen several times and still has a bad cough after trying several abx, pt asking what can she do to get relief from the cough.

## 2016-07-06 MED ORDER — HYDROCOD POLST-CPM POLST ER 10-8 MG/5ML PO SUER
5.0000 mL | Freq: Two times a day (BID) | ORAL | 0 refills | Status: DC | PRN
Start: 2016-07-06 — End: 2016-11-07

## 2016-07-06 NOTE — Telephone Encounter (Signed)
Spoke with patient about her cough The cough has not improved after several abx and prednisone. The cough is productive in the am and persistent thru the day-dry. Is not keeping her up at night Denies any sob or wheezing She is using the Tessalon pearls but not helping  Patient is concerned that the cough will hinder or interfere with her lower face lift scheduled in 2 weeks.  Please advise

## 2016-07-06 NOTE — Telephone Encounter (Signed)
Can still very well be post-infectious cough which can last up to 6 weeks after infection is adequately treated.  I would recommend an x-ray at this point just to verify everything looks good in the lungs.  We can alter her cough medication to Tussionex. She will have to pick up since it cannot be e-scribed or faxed. Is she noting any significant post-nasal drip or any acid reflux symptoms?  If no other symptoms are present and imaging is negative, we may need a Pulmonology consultation.

## 2016-07-06 NOTE — Telephone Encounter (Signed)
Advised patient her cough could be post-infectious cough. It has been 6+ weeks. She is agreeable with changing the cough medication to Tussionex which will be available at the front desk and order for the CXR.  Order placed for the CXR

## 2016-07-07 ENCOUNTER — Ambulatory Visit (INDEPENDENT_AMBULATORY_CARE_PROVIDER_SITE_OTHER): Payer: 59

## 2016-07-07 ENCOUNTER — Other Ambulatory Visit: Payer: Self-pay | Admitting: Physician Assistant

## 2016-07-07 DIAGNOSIS — R05 Cough: Secondary | ICD-10-CM

## 2016-07-07 DIAGNOSIS — R053 Chronic cough: Secondary | ICD-10-CM

## 2016-07-21 DIAGNOSIS — G4733 Obstructive sleep apnea (adult) (pediatric): Secondary | ICD-10-CM | POA: Diagnosis not present

## 2016-07-23 DIAGNOSIS — G4733 Obstructive sleep apnea (adult) (pediatric): Secondary | ICD-10-CM | POA: Diagnosis not present

## 2016-07-31 ENCOUNTER — Encounter: Payer: Self-pay | Admitting: Pulmonary Disease

## 2016-07-31 ENCOUNTER — Ambulatory Visit (INDEPENDENT_AMBULATORY_CARE_PROVIDER_SITE_OTHER): Payer: 59 | Admitting: Pulmonary Disease

## 2016-07-31 VITALS — BP 118/74 | HR 75 | Ht 64.5 in | Wt 172.8 lb

## 2016-07-31 DIAGNOSIS — R059 Cough, unspecified: Secondary | ICD-10-CM

## 2016-07-31 DIAGNOSIS — R05 Cough: Secondary | ICD-10-CM | POA: Diagnosis not present

## 2016-07-31 LAB — NITRIC OXIDE: NITRIC OXIDE: 8

## 2016-07-31 MED ORDER — CHLORPHENIRAMINE MALEATE 4 MG PO TABS: 8.0000 mg | ORAL_TABLET | Freq: Three times a day (TID) | ORAL | 2 refills | Status: DC

## 2016-07-31 MED ORDER — FLUTICASONE PROPIONATE 50 MCG/ACT NA SUSP: 2.0000 | Freq: Every day | NASAL | 2 refills | Status: DC

## 2016-07-31 NOTE — Progress Notes (Signed)
Sandra Benitez    [id number]    01-06-1961  Primary Care Physician:Katherine Birdie Riddle, MD  Referring Physician: Midge Minium, MD [address] [place], [place] [address]  Chief complaint:   Consult for Chronic cough  HPI: Sandra Benitez is a 56 year old with past medical history of sleep apnea, restless leg syndrome. She follows with Dr. Annamaria Boots for this and is on Sinemet. She had an episode of sinusitis 11 weeks ago and treated with 2 rounds of antibiotic. She has a persistent nonproductive cough with occasional wheezing, dyspnea with activity. She was given a prednisone taper last month which has not improved symptoms. She has perennial allergies and is on Zyrtec and Flonase. She denies any acid reflux. She's had lower facelift recently. She stopped using her CPAP machine since she is worried that the tubing may be dirty and it may be with making her cough worse.  Pets: Hypoallergenic dog Occupation: Dir. of distribution for [employer] Exposures: Some dust exposure at work, no asbestos, mold Smoking history: Never smoker  Outpatient Encounter Prescriptions as of 07/31/2016  Medication Sig  . albuterol (PROVENTIL HFA;VENTOLIN HFA) 108 (90 Base) MCG/ACT inhaler Inhale 2 puffs into the lungs every 6 (six) hours as needed for wheezing or shortness of breath.  . ALPRAZolam (XANAX) 0.25 MG tablet Take 1 tablet (0.25 mg total) by mouth 3 (three) times daily.  . carbidopa-levodopa (SINEMET IR) 25-100 MG tablet Take 1 tablet by mouth at bedtime.  . cetirizine (ZYRTEC) 10 MG tablet Take 10 mg by mouth daily.  . chlorpheniramine-HYDROcodone (TUSSIONEX PENNKINETIC ER) 10-8 MG/5ML SUER Take 5 mLs by mouth every 12 (twelve) hours as needed for cough.  . escitalopram (LEXAPRO) 5 MG tablet Take 5 mg by mouth daily.  . fluticasone (FLONASE) 50 MCG/ACT nasal spray Place 2 sprays into both nostrils daily.  Marland Kitchen levonorgestrel (MIRENA) 20 MCG/24HR IUD 1 each by Intrauterine route once.  . meloxicam  (MOBIC) 15 MG tablet TAKE 1 TABLET (15 MG TOTAL) BY MOUTH DAILY.  . metoprolol tartrate (LOPRESSOR) 25 MG tablet Take 12.5 mg by mouth daily.   . [DISCONTINUED] bimatoprost (LATISSE) 0.03 % ophthalmic solution APPLY TO AFFECTED EYELIDS QHS.  . [DISCONTINUED] predniSONE (DELTASONE) 10 MG tablet 3 tabs x3 days and then 2 tabs x3 days and then 1 tab x3 days.  Take w/ food.   No facility-administered encounter medications on file as of 07/31/2016.     Allergies as of 07/31/2016 - Review Complete 07/31/2016  Allergen Reaction Noted  . Penicillins  12/22/2011  . Other  05/25/2015    Past Medical History:  Diagnosis Date  . Anemia   . IBS (irritable bowel syndrome)   . Sleep apnea   . Yeast infection     Past Surgical History:  Procedure Laterality Date  . FACIAL COSMETIC SURGERY      Family History  Problem Relation Age of Onset  . Colon cancer Maternal Grandfather   . Heart disease Father   . Stroke Father   . Hypertension Father   . Hyperlipidemia Father   . Heart attack Father   . Heart disease Brother   . Stroke Brother   . Hypertension Brother   . Diverticulitis Mother   . Alcohol abuse Brother     Social History   Social History  . Marital status: Unknown    Spouse name: N/A  . Number of children: N/A  . Years of education: N/A   Occupational  History  . Not on file.   Social History Main Topics  . Smoking status: Never Smoker  . Smokeless tobacco: Never Used  . Alcohol use Yes     Comment: Occasional  . Drug use: No  . Sexual activity: Not Currently    Birth control/ protection: IUD     Comment: MIRENA inserted 09-20-09   Other Topics Concern  . Not on file   Social History Narrative  . No narrative on file    Review of systems: Review of Systems  Constitutional: Negative for fever and chills.  HENT: Negative.   Eyes: Negative for blurred vision.  Respiratory: as per HPI  Cardiovascular: Negative for chest pain and palpitations.    Gastrointestinal: Negative for vomiting, diarrhea, blood per rectum. Genitourinary: Negative for dysuria, urgency, frequency and hematuria.  Musculoskeletal: Negative for myalgias, back pain and joint pain.  Skin: Negative for itching and rash.  Neurological: Negative for dizziness, tremors, focal weakness, seizures and loss of consciousness.  Endo/Heme/Allergies: Negative for environmental allergies.  Psychiatric/Behavioral: Negative for depression, suicidal ideas and hallucinations.  All other systems reviewed and are negative.  Physical Exam: Blood pressure 118/74, pulse 75, height 5' 4.5" (1.638 m), weight 172 lb 12.8 oz (78.4 kg), SpO2 97 %. Gen:      No acute distress HEENT:  EOMI, sclera anicteric Neck:     No masses; no thyromegaly Lungs:    Clear to auscultation bilaterally; normal respiratory effort CV:         Regular rate and rhythm; no murmurs Abd:      + bowel sounds; soft, non-tender; no palpable masses, no distension Ext:    No edema; adequate peripheral perfusion Skin:      Warm and dry; no rash Neuro: alert and oriented x 3 Psych: normal mood and affect  Data Reviewed: FENO 07/31/16- 8  Home Sleep Test-11/10/2015-severe obstructive sleep apnea-AHI 46.6/hour, desaturation to 81%, body weight 168 pounds  Chest x-ray 07/07/16-no acute cardiopulmonary abnormality Chest x-ray 05/25/15-no acute cardiopulmonary abnormality Have reviewed all images personally.  Assessment:  Chronic cough Likely postinfectious from recent sinusitis, bronchitis. Low FENO in office today indicates no airway inflammation. Suspicion for asthma as low.  Start Chlorpheniramine 8 mg 3 times daily to reduce nasal congestion, postnasal drip. Continue Flonase nasal spray. IF symptoms persist then get PFTs. She has albuterol rescue inhaler but rarely needs to use it. I educated her on behavioral changes to deal with cough including conscious suppression of the urge to cough, use of menthol free throat  lozenges.  Untreated OSA Been off CPAP for over a month and this may be contributing to her persistent cough. I have asked her to resume CPAP therapy.  Plan/Recommendations: - Chlropheniramine 8 mg tid, - Continue flonase, albuterol PRN - Resume CPAP   Marshell Garfinkel MD [facility] Pager [phone] 07/31/2016, 10:50 AM  CC: Midge Minium, MD

## 2016-07-31 NOTE — Patient Instructions (Signed)
Start chlorpheniramine 8 mg 3 times daily, continue Flonase nasal spray Resume using the CPAP Continue the albuterol as needed  Return in 2-3 months.

## 2016-08-22 DIAGNOSIS — G4733 Obstructive sleep apnea (adult) (pediatric): Secondary | ICD-10-CM | POA: Diagnosis not present

## 2016-08-30 DIAGNOSIS — H00024 Hordeolum internum left upper eyelid: Secondary | ICD-10-CM | POA: Diagnosis not present

## 2016-09-11 ENCOUNTER — Encounter: Payer: Self-pay | Admitting: Internal Medicine

## 2016-09-11 ENCOUNTER — Ambulatory Visit (INDEPENDENT_AMBULATORY_CARE_PROVIDER_SITE_OTHER): Payer: 59 | Admitting: Internal Medicine

## 2016-09-11 DIAGNOSIS — G4733 Obstructive sleep apnea (adult) (pediatric): Secondary | ICD-10-CM | POA: Diagnosis not present

## 2016-09-11 DIAGNOSIS — G2581 Restless legs syndrome: Secondary | ICD-10-CM | POA: Diagnosis not present

## 2016-09-11 DIAGNOSIS — J219 Acute bronchiolitis, unspecified: Secondary | ICD-10-CM

## 2016-09-11 DIAGNOSIS — J209 Acute bronchitis, unspecified: Secondary | ICD-10-CM

## 2016-09-11 NOTE — Patient Instructions (Signed)
We can continue CPAP auto 5-20, mask of choice humidifier, supplies, Airview     Dx OSA  Please call if we can help

## 2016-09-11 NOTE — Progress Notes (Signed)
HPI female never smoker followed for OSA,  restless leg syndrome, complicated by anemia, anxiety, IBS Unattended Home Sleep Test-11/10/2015-severe obstructive sleep apnea-AHI 46.6/hour, desaturation to 81%, body weight 168 pounds -----------------------------------------------------------------------------------------------------------------.  03/13/2016-56 year old female never smoker followed for OSA,  restless leg syndrome Follows for CPAP follow up.  CPAP auto 5-20/ [facility] Describes good start with no major discomforts. Download documents 83%/4 hour compliance, AHI 1.9/hour. Sleeping better  09/11/16- 56 year old female never smoker followed for OSA,  restless leg syndrome CPAP auto 5-20/[facility] OSA patient is sleeping abouth 7 to 8 hours a night now before she had facical surgery and then a cough is why she didn't use the CPAP machine  She was evaluated by Dr.Mannam for cough and told to resume CPAP when he saw her in April. He had her use chlorpheniramine and Flonase for cough. Sinemet for restless leg syndrome download confirms excellent control when used and she is using CPAP regularly again starting this week after recovery from facial plastic surgery and her cough. She is very comfortable with the pressure range.  ROS-see HPI   Negative unless "+" Constitutional:    weight loss, night sweats, fevers, chills, fatigue, lassitude. HEENT:    headaches, difficulty swallowing, tooth/dental problems, sore throat,       sneezing, itching, ear ache, nasal congestion, post nasal drip, snoring CV:    chest pain, orthopnea, PND, swelling in lower extremities, anasarca,                                                     dizziness, palpitations Resp:   shortness of breath with exertion or at rest.                productive cough,   non-productive cough, coughing up of blood.              change in color of mucus.  wheezing.   Skin:    rash or lesions. GI:  No-   heartburn, indigestion,  abdominal pain, nausea, vomiting, diarrhea,                 change in bowel habits, loss of appetite GU: dysuria, change in color of urine, no urgency or frequency.   flank pain. MS:   joint pain, stiffness, decreased range of motion, back pain. Neuro-     nothing unusual Psych:  change in mood or affect.  depression or anxiety.   memory loss.  OBJ- Physical Exam General- Alert, Oriented, Affect-appropriate, Distress- none acute Skin- rash-none, lesions- none, excoriation- none Lymphadenopathy- none Head- atraumatic            Eyes- Gross vision intact, PERRLA, conjunctivae and secretions clear            Ears- Hearing, canals-normal            Nose- Clear, no-Septal dev, mucus, polyps, erosion, perforation             Throat- Mallampati III-IV , mucosa clear , drainage- none, tonsils- atrophic Neck- flexible , trachea midline, no stridor , thyroid nl, carotid no bruit Chest - symmetrical excursion , unlabored           Heart/CV- RRR , no murmur , no gallop  , no rub, nl s1 s2                           -  JVD- none , edema- none, stasis changes- none, varices- none           Lung- clear to P&A, wheeze- none, cough- none , dullness-none, rub- none           Chest wall-  Abd-  Br/ Gen/ Rectal- Not done, not indicated Extrem- cyanosis- none, clubbing, none, atrophy- none, strength- nl Neuro- grossly intact to observation

## 2016-09-12 DIAGNOSIS — J219 Acute bronchiolitis, unspecified: Secondary | ICD-10-CM

## 2016-09-12 DIAGNOSIS — J209 Acute bronchitis, unspecified: Secondary | ICD-10-CM | POA: Insufficient documentation

## 2016-09-12 NOTE — Assessment & Plan Note (Signed)
Sinemet has provided comfortable control of restless leg syndrome with no changes needed.

## 2016-09-12 NOTE — Assessment & Plan Note (Signed)
Acute bronchitis with persistent cough for slow to clear but have finally resolved. I don't expect a recurrent problem this point.

## 2016-09-12 NOTE — Assessment & Plan Note (Signed)
She had be off of CPAP use for cosmetic surgery and problematic cough but both have improved and she is resuming regular compliant use. I don't think there will be any problems.

## 2016-09-19 DIAGNOSIS — L821 Other seborrheic keratosis: Secondary | ICD-10-CM | POA: Diagnosis not present

## 2016-09-19 DIAGNOSIS — D1800 Hemangioma unspecified site: Secondary | ICD-10-CM | POA: Diagnosis not present

## 2016-09-19 DIAGNOSIS — L814 Other melanin hyperpigmentation: Secondary | ICD-10-CM | POA: Diagnosis not present

## 2016-09-21 DIAGNOSIS — G4733 Obstructive sleep apnea (adult) (pediatric): Secondary | ICD-10-CM | POA: Diagnosis not present

## 2016-09-22 ENCOUNTER — Telehealth: Payer: Self-pay | Admitting: Family Medicine

## 2016-09-22 DIAGNOSIS — G4733 Obstructive sleep apnea (adult) (pediatric): Secondary | ICD-10-CM | POA: Diagnosis not present

## 2016-09-22 MED ORDER — CARBIDOPA-LEVODOPA 25-100 MG PO TABS: 2.0000 | ORAL_TABLET | Freq: Every day | ORAL | 6 refills | Status: DC

## 2016-09-22 NOTE — Telephone Encounter (Signed)
Ok to increase to 2 tabs nightly.  We can adjust prescription accordingly (#180, 1 refill)

## 2016-09-22 NOTE — Telephone Encounter (Signed)
Pt LMOVM stating that she would like Rx sent to [facility] on [address]

## 2016-09-22 NOTE — Telephone Encounter (Signed)
Pt had her CPE on 06/2016. Would you like for her to have a new appointment to discuss as this has been 3 months?

## 2016-09-22 NOTE — Telephone Encounter (Signed)
Called pt and advised that Dr. Birdie Riddle advised we can increase her medication. I just need to know what pharmacy she would prefer me to send it to.

## 2016-09-22 NOTE — Telephone Encounter (Signed)
Medication filled to pharmacy as requested.   

## 2016-09-22 NOTE — Addendum Note (Signed)
Addended by: Davis Gourd on: 09/22/2016 03:12 PM   Modules accepted: Orders

## 2016-09-22 NOTE — Telephone Encounter (Signed)
Pt Sandra Benitez asking if KT would increase her carbidopa-levodopa, pt states that she has been on the same dose for years and it seems to not be working as well for her restless leg syndrome.

## 2016-10-24 DIAGNOSIS — G4733 Obstructive sleep apnea (adult) (pediatric): Secondary | ICD-10-CM | POA: Diagnosis not present

## 2016-11-07 ENCOUNTER — Encounter: Payer: Self-pay | Admitting: Physician Assistant

## 2016-11-07 ENCOUNTER — Encounter: Payer: Self-pay | Admitting: Emergency Medicine

## 2016-11-07 ENCOUNTER — Ambulatory Visit (INDEPENDENT_AMBULATORY_CARE_PROVIDER_SITE_OTHER): Payer: 59 | Admitting: Physician Assistant

## 2016-11-07 VITALS — BP 110/84 | HR 74 | Temp 99.4°F | Resp 14 | Ht 65.0 in | Wt 177.0 lb

## 2016-11-07 DIAGNOSIS — J208 Acute bronchitis due to other specified organisms: Secondary | ICD-10-CM

## 2016-11-07 DIAGNOSIS — B9689 Other specified bacterial agents as the cause of diseases classified elsewhere: Secondary | ICD-10-CM

## 2016-11-07 DIAGNOSIS — T7840XA Allergy, unspecified, initial encounter: Secondary | ICD-10-CM | POA: Diagnosis not present

## 2016-11-07 MED ORDER — DOXYCYCLINE HYCLATE 100 MG PO CAPS: 100.0000 mg | ORAL_CAPSULE | Freq: Two times a day (BID) | ORAL | 0 refills | Status: DC

## 2016-11-07 MED ORDER — METHYLPREDNISOLONE ACETATE 80 MG/ML IJ SUSP
80.0000 mg | Freq: Once | INTRAMUSCULAR | Status: AC
  Administered 2016-11-07: 80 mg via INTRAMUSCULAR

## 2016-11-07 NOTE — Progress Notes (Signed)
Patient presents to clinic today c/o 1 week of chest congestion with cough productive of yellow sputum, worse in the morning. Notes low-grade fever with Tmax of 100. Denies sinus pressure, sinus pain, chest pain or SOB.   Also notes 1 day of puffiness underneath R eye and R upper lip. Denies new medications or change to soaps/lotions/detergents. Does have a dog in the home that she lets sleep in the bed with her. Denies new or abnormal food or water source. Denies SOB or difficulty swallowing.   Past Medical History:  Diagnosis Date  . Anemia   . IBS (irritable bowel syndrome)   . Sleep apnea   . Yeast infection     Current Outpatient Prescriptions on File Prior to Visit  Medication Sig Dispense Refill  . albuterol (PROVENTIL HFA;VENTOLIN HFA) 108 (90 Base) MCG/ACT inhaler Inhale 2 puffs into the lungs every 6 (six) hours as needed for wheezing or shortness of breath. 1 Inhaler 0  . ALPRAZolam (XANAX) 0.25 MG tablet Take 1 tablet (0.25 mg total) by mouth 3 (three) times daily. 60 tablet 0  . carbidopa-levodopa (SINEMET IR) 25-100 MG tablet Take 2 tablets by mouth at bedtime. 60 tablet 6  . cetirizine (ZYRTEC) 10 MG tablet Take 10 mg by mouth daily.    . chlorpheniramine (CHLOR-TRIMETON) 4 MG tablet Take 2 tablets (8 mg total) by mouth 3 (three) times daily. 90 tablet 2  . escitalopram (LEXAPRO) 5 MG tablet Take 5 mg by mouth daily.    . fluticasone (FLONASE) 50 MCG/ACT nasal spray Place 2 sprays into both nostrils daily. 16 g 6  . fluticasone (FLONASE) 50 MCG/ACT nasal spray Place 2 sprays into both nostrils daily. 16 g 2  . levonorgestrel (MIRENA) 20 MCG/24HR IUD 1 each by Intrauterine route once.    . meloxicam (MOBIC) 15 MG tablet TAKE 1 TABLET (15 MG TOTAL) BY MOUTH DAILY. 30 tablet 0  . metoprolol tartrate (LOPRESSOR) 25 MG tablet Take 12.5 mg by mouth daily.      No current facility-administered medications on file prior to visit.     Allergies  Allergen Reactions  .  Penicillins     Has patient had a PCN reaction causing immediate rash, facial/tongue/throat swelling, SOB or lightheadedness with hypotension: YES Has patient had a PCN reaction causing severe rash involving mucus membranes or skin necrosis: NO Has patient had a PCN reaction that required hospitalization NO Has patient had a PCN reaction occurring within the last 10 years: NO If all of the above answers are "NO", then may proceed with Cephalosporin use.  . Other     Fluoroquinolone--- Rash   . Bactrim [Sulfamethoxazole-Trimethoprim] Rash    Family History  Problem Relation Age of Onset  . Colon cancer Maternal Grandfather   . Heart disease Father   . Stroke Father   . Hypertension Father   . Hyperlipidemia Father   . Heart attack Father   . Heart disease Brother   . Stroke Brother   . Hypertension Brother   . Diverticulitis Mother   . Alcohol abuse Brother     Social History   Social History  . Marital status: Divorced    Spouse name: N/A  . Number of children: N/A  . Years of education: N/A   Social History Main Topics  . Smoking status: Never Smoker  . Smokeless tobacco: Never Used  . Alcohol use Yes     Comment: Occasional  . Drug use: No  . Sexual activity: Not  Currently    Birth control/ protection: IUD     Comment: MIRENA inserted 09-20-09   Other Topics Concern  . None   Social History Narrative  . None   Review of Systems - See HPI.  All other ROS are negative.  BP 110/84   Pulse 74   Temp 99.4 F (37.4 C) (Oral)   Resp 14   Ht 5\' 5"  (1.651 m)   Wt 177 lb (80.3 kg)   SpO2 97%   BMI 29.45 kg/m   Physical Exam  Constitutional: She is oriented to person, place, and time and well-developed, well-nourished, and in no distress.  HENT:  Head: Normocephalic and atraumatic.  Right Ear: External ear normal.  Left Ear: External ear normal.  Nose: Nose normal.  Mouth/Throat: Oropharynx is clear and moist. No oropharyngeal exudate.  TM within normal  limits bilaterally.  Mild puffiness of R sided of face in the infraorbital region and upper lip without lesion or drainage.   Eyes: Conjunctivae are normal.  Neck: Neck supple.  Cardiovascular: Normal rate, regular rhythm, normal heart sounds and intact distal pulses.   Pulmonary/Chest: Effort normal and breath sounds normal. No respiratory distress. She has no wheezes. She has no rales. She exhibits no tenderness.  Lymphadenopathy:    She has no cervical adenopathy.  Neurological: She is alert and oriented to person, place, and time.  Skin: Skin is warm and dry.  Vitals reviewed.  Assessment/Plan: 1. Allergic reaction, initial encounter Unclear etiology. Doubt food or hygiene product as there are no recent changes. Concern for contact with irritant from dog hair as the dog rubs on her pillow often. Patient to clean linens and give dog a bath. No makeup and limit lotions until resolved. 80 mg IM depomedrol given today. Start Benadryl at night. Continue AM zyrtec. ER if anything worsens. May need to consider allergist if this is not an isolated occurrence.   - methylPREDNISolone acetate (DEPO-MEDROL) injection 80 mg; Inject 1 mL (80 mg total) into the muscle once.  2. Acute bacterial bronchitis Rx doxycycline.  Increase fluids.  Rest.  Saline nasal spray.  Probiotic.  Mucinex as directed.  Humidifier in bedroom. OTC medications reviewed.  Call or return to clinic if symptoms are not improving.    Leeanne Rio, PA-C

## 2016-11-07 NOTE — Patient Instructions (Signed)
For the lip swelling -- I am concerned this is an allergic exposure from an environmental irritant since there have been no changes in soaps/lotions/detergents or new foods. The steroid shot given today should help calm this down. Start Benadryl at night short term. Continue daily Zyrtec. Wash all bed linens and make sure the dog has a bath before you let her back into the bed. Any worsening symptoms you need to go to the ER.   Take antibiotic (Doxycycline) as directed.  Increase fluids.  Get plenty of rest. Use Mucinex for congestion. Take a daily probiotic (I recommend Align or Culturelle, but even Activia Yogurt may be beneficial).  A humidifier placed in the bedroom may offer some relief for a dry, scratchy throat of nasal irritation.  Read information below on acute bronchitis. Please call or return to clinic if symptoms are not improving.  Acute Bronchitis Bronchitis is when the airways that extend from the windpipe into the lungs get red, puffy, and painful (inflamed). Bronchitis often causes thick spit (mucus) to develop. This leads to a cough. A cough is the most common symptom of bronchitis. In acute bronchitis, the condition usually begins suddenly and goes away over time (usually in 2 weeks). Smoking, allergies, and asthma can make bronchitis worse. Repeated episodes of bronchitis may cause more lung problems.  HOME CARE  Rest.  Drink enough fluids to keep your pee (urine) clear or pale yellow (unless you need to limit fluids as told by your doctor).  Only take over-the-counter or prescription medicines as told by your doctor.  Avoid smoking and secondhand smoke. These can make bronchitis worse. If you are a smoker, think about using nicotine gum or skin patches. Quitting smoking will help your lungs heal faster.  Reduce the chance of getting bronchitis again by:  Washing your hands often.  Avoiding people with cold symptoms.  Trying not to touch your hands to your mouth, nose, or  eyes.  Follow up with your doctor as told.  GET HELP IF: Your symptoms do not improve after 1 week of treatment. Symptoms include:  Cough.  Fever.  Coughing up thick spit.  Body aches.  Chest congestion.  Chills.  Shortness of breath.  Sore throat.  GET HELP RIGHT AWAY IF:   You have an increased fever.  You have chills.  You have severe shortness of breath.  You have bloody thick spit (sputum).  You throw up (vomit) often.  You lose too much body fluid (dehydration).  You have a severe headache.  You faint.  MAKE SURE YOU:   Understand these instructions.  Will watch your condition.  Will get help right away if you are not doing well or get worse. Document Released: 09/06/2007 Document Revised: 11/20/2012 Document Reviewed: 09/10/2012 [facility] [employer] Patient Information 2015 [place], [place]. This information is not intended to replace advice given to you by your health care provider. Make sure you discuss any questions you have with your health care provider.

## 2016-11-07 NOTE — Progress Notes (Signed)
Pre visit review using our clinic review tool, if applicable. No additional management support is needed unless otherwise documented below in the visit note. 

## 2016-11-23 DIAGNOSIS — G4733 Obstructive sleep apnea (adult) (pediatric): Secondary | ICD-10-CM | POA: Diagnosis not present

## 2016-12-25 DIAGNOSIS — G4733 Obstructive sleep apnea (adult) (pediatric): Secondary | ICD-10-CM | POA: Diagnosis not present

## 2017-01-04 DIAGNOSIS — G4733 Obstructive sleep apnea (adult) (pediatric): Secondary | ICD-10-CM | POA: Diagnosis not present

## 2017-01-04 DIAGNOSIS — Z23 Encounter for immunization: Secondary | ICD-10-CM | POA: Diagnosis not present

## 2017-01-08 DIAGNOSIS — S9031XA Contusion of right foot, initial encounter: Secondary | ICD-10-CM | POA: Diagnosis not present

## 2017-01-08 DIAGNOSIS — M79671 Pain in right foot: Secondary | ICD-10-CM | POA: Diagnosis not present

## 2017-01-15 DIAGNOSIS — H00021 Hordeolum internum right upper eyelid: Secondary | ICD-10-CM | POA: Diagnosis not present

## 2017-01-19 DIAGNOSIS — L570 Actinic keratosis: Secondary | ICD-10-CM | POA: Diagnosis not present

## 2017-01-24 DIAGNOSIS — G4733 Obstructive sleep apnea (adult) (pediatric): Secondary | ICD-10-CM | POA: Diagnosis not present

## 2017-02-06 ENCOUNTER — Telehealth: Payer: Self-pay | Admitting: Internal Medicine

## 2017-02-06 ENCOUNTER — Other Ambulatory Visit: Payer: Self-pay | Admitting: Family Medicine

## 2017-02-06 NOTE — Telephone Encounter (Signed)
Called and spoke with pt and she stated that she will contact the company to get in contact with [place] about her cpap machine.  She stated that she will call them today as she had already purchased the machine and has been charged for it, but does not have the machine yet.

## 2017-02-06 NOTE — Telephone Encounter (Signed)
Please advise Last OV 11/07/16 meloxicam last filled 06/01/16 #30 with 0

## 2017-02-06 NOTE — Telephone Encounter (Signed)
I don't see that any request has come in. Ok to work with her to order the machine she wantts, auto 5-20, mask of choice, humidifier supplies   Dx OSA

## 2017-02-06 NOTE — Telephone Encounter (Signed)
Spoke with pt, who states she had requested travel cpap online though [email or web address]. Pt state she received notification that [facility] would receive request for Rx for travel cpap on 01/23/17.  [facility] please advise. Thanks.

## 2017-02-07 DIAGNOSIS — H5213 Myopia, bilateral: Secondary | ICD-10-CM | POA: Diagnosis not present

## 2017-02-07 MED ORDER — MELOXICAM 15 MG PO TABS: 15.0000 mg | ORAL_TABLET | Freq: Every day | ORAL | 0 refills | Status: DC

## 2017-02-23 DIAGNOSIS — G4733 Obstructive sleep apnea (adult) (pediatric): Secondary | ICD-10-CM | POA: Diagnosis not present

## 2017-03-02 DIAGNOSIS — Z1231 Encounter for screening mammogram for malignant neoplasm of breast: Secondary | ICD-10-CM | POA: Diagnosis not present

## 2017-03-21 ENCOUNTER — Encounter: Payer: Self-pay | Admitting: Family Medicine

## 2017-03-28 DIAGNOSIS — G4733 Obstructive sleep apnea (adult) (pediatric): Secondary | ICD-10-CM | POA: Diagnosis not present

## 2017-04-16 ENCOUNTER — Other Ambulatory Visit: Payer: Self-pay | Admitting: Family Medicine

## 2017-04-25 DIAGNOSIS — N951 Menopausal and female climacteric states: Secondary | ICD-10-CM | POA: Diagnosis not present

## 2017-04-25 DIAGNOSIS — Z124 Encounter for screening for malignant neoplasm of cervix: Secondary | ICD-10-CM | POA: Diagnosis not present

## 2017-04-25 DIAGNOSIS — Z01419 Encounter for gynecological examination (general) (routine) without abnormal findings: Secondary | ICD-10-CM | POA: Diagnosis not present

## 2017-04-25 LAB — HM PAP SMEAR

## 2017-04-27 DIAGNOSIS — G4733 Obstructive sleep apnea (adult) (pediatric): Secondary | ICD-10-CM | POA: Diagnosis not present

## 2017-05-01 ENCOUNTER — Encounter: Payer: Self-pay | Admitting: General Practice

## 2017-05-09 ENCOUNTER — Telehealth: Payer: Self-pay | Admitting: Pulmonary Disease

## 2017-05-09 MED ORDER — FLUTICASONE PROPIONATE 50 MCG/ACT NA SUSP: 2.0000 | Freq: Every day | NASAL | 0 refills | Status: DC

## 2017-05-09 NOTE — Telephone Encounter (Signed)
Received fax from [facility] stated that patient would like to begin receiving her Flonase from [facility] Last ov w/ PM 4.30.18 with recs to follow up in 2-3 months: Patient Instructions  Start chlorpheniramine 8 mg 3 times daily, continue Flonase nasal spray Resume using the CPAP Continue the albuterol as needed   Return in 2-3 months.    Patient does see [facility] but for OSA - upcoming appt on 6.11.19 Refill sent x1 with no additional refills and note to pharmacy that pt is overdue for appt

## 2017-05-28 DIAGNOSIS — G4733 Obstructive sleep apnea (adult) (pediatric): Secondary | ICD-10-CM | POA: Diagnosis not present

## 2017-06-11 ENCOUNTER — Other Ambulatory Visit: Payer: Self-pay | Admitting: Pulmonary Disease

## 2017-06-21 ENCOUNTER — Other Ambulatory Visit: Payer: Self-pay | Admitting: Family Medicine

## 2017-06-21 NOTE — Telephone Encounter (Signed)
Rx refill request: Mobic 15 mg  LOV: 11/07/16   PCP: Birdie Riddle   Pharmacy: [facility]

## 2017-06-21 NOTE — Telephone Encounter (Signed)
Last filled: 04/16/17   Okay to refill?

## 2017-06-21 NOTE — Telephone Encounter (Signed)
Copied from [place] [id number]. Topic: Quick Communication - Rx Refill/Question >> Jun 21, 2017  9:28 AM Synthia Innocent wrote: Medication: meloxicam (MOBIC) 15 MG tablet  Has the patient contacted their pharmacy? Yes.   (Agent: If no, request that the patient contact the pharmacy for the refill.) Preferred Pharmacy (with phone number or street name): [facility] Agent: Please be advised that RX refills may take up to 3 business days. We ask that you follow-up with your pharmacy.  REF #[id number]

## 2017-06-22 MED ORDER — MELOXICAM 15 MG PO TABS: 15.0000 mg | ORAL_TABLET | Freq: Every day | ORAL | 3 refills | Status: DC

## 2017-06-26 DIAGNOSIS — H9209 Otalgia, unspecified ear: Secondary | ICD-10-CM | POA: Diagnosis not present

## 2017-06-26 DIAGNOSIS — H6590 Unspecified nonsuppurative otitis media, unspecified ear: Secondary | ICD-10-CM | POA: Diagnosis not present

## 2017-06-27 DIAGNOSIS — G4733 Obstructive sleep apnea (adult) (pediatric): Secondary | ICD-10-CM | POA: Diagnosis not present

## 2017-07-17 ENCOUNTER — Other Ambulatory Visit: Payer: Self-pay | Admitting: General Practice

## 2017-07-17 MED ORDER — MELOXICAM 15 MG PO TABS: 15.0000 mg | ORAL_TABLET | Freq: Every day | ORAL | 0 refills | Status: DC

## 2017-07-17 NOTE — Telephone Encounter (Signed)
Last OV 11/07/16 meloxicam last filled 06/22/17 #30 with 3  Pt would like this filled to mail order for #90.

## 2017-07-27 DIAGNOSIS — G4733 Obstructive sleep apnea (adult) (pediatric): Secondary | ICD-10-CM | POA: Diagnosis not present

## 2017-08-20 ENCOUNTER — Ambulatory Visit (INDEPENDENT_AMBULATORY_CARE_PROVIDER_SITE_OTHER): Payer: 59 | Admitting: Family Medicine

## 2017-08-20 ENCOUNTER — Other Ambulatory Visit: Payer: Self-pay

## 2017-08-20 ENCOUNTER — Encounter: Payer: Self-pay | Admitting: Family Medicine

## 2017-08-20 VITALS — BP 112/70 | HR 76 | Temp 99.1°F | Resp 16 | Ht 64.0 in | Wt 171.4 lb

## 2017-08-20 DIAGNOSIS — J329 Chronic sinusitis, unspecified: Secondary | ICD-10-CM | POA: Diagnosis not present

## 2017-08-20 DIAGNOSIS — Z0184 Encounter for antibody response examination: Secondary | ICD-10-CM

## 2017-08-20 DIAGNOSIS — B9689 Other specified bacterial agents as the cause of diseases classified elsewhere: Secondary | ICD-10-CM

## 2017-08-20 MED ORDER — DOXYCYCLINE HYCLATE 100 MG PO TABS: 100.0000 mg | ORAL_TABLET | Freq: Two times a day (BID) | ORAL | 0 refills | Status: DC

## 2017-08-20 MED ORDER — GUAIFENESIN-CODEINE 100-10 MG/5ML PO SYRP: 10.0000 mL | ORAL_SOLUTION | Freq: Three times a day (TID) | ORAL | 0 refills | Status: DC | PRN

## 2017-08-20 NOTE — Progress Notes (Signed)
   Subjective:    Patient ID: Sandra Benitez, female    DOB: [birth year], 56 y.o.   MRN: [id number]  HPI URI- sxs started w/ sore throat 5 days ago.  Then developed wet cough- productive.  + low grade temps, Tm 99.5  + maxillary pressure, HA.  No ear pain.  No tooth pain.  + nausea, no vomiting.  + sick contacts.  Immunity status testing- pt has upcoming trip and wants MMR immunity test  Review of Systems For ROS see HPI     Objective:   Physical Exam  Constitutional: She is oriented to person, place, and time. She appears well-developed and well-nourished. No distress.  HENT:  Head: Normocephalic and atraumatic.  Right Ear: Tympanic membrane normal.  Left Ear: Tympanic membrane normal.  Nose: Mucosal edema and rhinorrhea present. Right sinus exhibits maxillary sinus tenderness and frontal sinus tenderness. Left sinus exhibits maxillary sinus tenderness and frontal sinus tenderness.  Mouth/Throat: Uvula is midline and mucous membranes are normal. Posterior oropharyngeal erythema present. No oropharyngeal exudate.  Eyes: Pupils are equal, round, and reactive to light. Conjunctivae and EOM are normal.  Neck: Normal range of motion. Neck supple.  Cardiovascular: Normal rate, regular rhythm and normal heart sounds.  Pulmonary/Chest: Effort normal and breath sounds normal. No respiratory distress. She has no wheezes.  + hacking cough  Lymphadenopathy:    She has no cervical adenopathy.  Neurological: She is alert and oriented to person, place, and time.  Vitals reviewed.         Assessment & Plan:  Bacterial sinusitis- new.  Pt's sxs and PE consistent w/ infxn.  Start abx.  Cough meds prn.  Reviewed supportive care and red flags that should prompt return.  Pt expressed understanding and is in agreement w/ plan.   Immunity status testing- check MMR immunity as pt is traveling.

## 2017-08-20 NOTE — Patient Instructions (Signed)
Follow up as needed or as scheduled We'll notify you of your lab results and make any changes if needed START the Doxy twice daily- take w/ food Drink plenty of fluids REST! Cough syrup as needed- may cause drowsiness Mucinex DM for daytime cough and congestion Call with any questions or concerns Hang in there!!!

## 2017-08-21 LAB — MEASLES/MUMPS/RUBELLA IMMUNITY
Mumps IgG: <9 AU/mL — ABNORMAL LOW
RUBEOLA IGG: 157 [AU]/ml
Rubella: >33 index

## 2017-08-26 ENCOUNTER — Other Ambulatory Visit: Payer: Self-pay | Admitting: Family Medicine

## 2017-08-28 NOTE — Telephone Encounter (Signed)
Last OV 08/20/2017, Next OV 01/10/2018  Last filled 07/17/2017, # 90 with 0 refills

## 2017-08-29 DIAGNOSIS — G4733 Obstructive sleep apnea (adult) (pediatric): Secondary | ICD-10-CM | POA: Diagnosis not present

## 2017-09-05 ENCOUNTER — Encounter: Payer: 59 | Admitting: Family Medicine

## 2017-09-06 ENCOUNTER — Encounter: Payer: Self-pay | Admitting: Family Medicine

## 2017-09-06 ENCOUNTER — Ambulatory Visit (INDEPENDENT_AMBULATORY_CARE_PROVIDER_SITE_OTHER): Payer: 59 | Admitting: Family Medicine

## 2017-09-06 ENCOUNTER — Other Ambulatory Visit: Payer: Self-pay

## 2017-09-06 VITALS — BP 116/78 | HR 68 | Temp 98.5°F | Resp 16 | Ht 64.0 in | Wt 173.0 lb

## 2017-09-06 DIAGNOSIS — R251 Tremor, unspecified: Secondary | ICD-10-CM | POA: Diagnosis not present

## 2017-09-06 DIAGNOSIS — Z23 Encounter for immunization: Secondary | ICD-10-CM

## 2017-09-06 DIAGNOSIS — R41840 Attention and concentration deficit: Secondary | ICD-10-CM | POA: Diagnosis not present

## 2017-09-06 MED ORDER — BUPROPION HCL 75 MG PO TABS: 75.0000 mg | ORAL_TABLET | Freq: Two times a day (BID) | ORAL | 3 refills | Status: DC

## 2017-09-06 MED ORDER — ESCITALOPRAM OXALATE 5 MG PO TABS: 5.0000 mg | ORAL_TABLET | Freq: Every day | ORAL | 6 refills | Status: DC

## 2017-09-06 NOTE — Progress Notes (Signed)
   Subjective:    Patient ID: Sandra Benitez, female    DOB: 16-Dec-1960, 57 y.o.   MRN: [id number]  HPI Tremor- pt reports over the last few years she has had intermittent tremors when trying to use a pincher grasp (like eating a french fry).  Friend recently noted some tremor at rest.  Tremor is R sided, not reproducible.  Currently on Carbidopa-Levodopa for RLS (has been taking for ~20 yrs)  No weakness of grip.  sxs are worse as the day goes on.  Concentration issues- 'I felt like I was all over the place and can't focus for the last 6 months'.  Pt is having difficulty w/ task completion.  Coworkers have not noticed but pt is concerned about this.  Denies feeling anxious or depressed.  Pt feels this is hormonal.   Review of Systems For ROS see HPI     Objective:   Physical Exam  Constitutional: She is oriented to person, place, and time. She appears well-developed and well-nourished. No distress.  HENT:  Head: Normocephalic and atraumatic.  Eyes: Pupils are equal, round, and reactive to light. Conjunctivae and EOM are normal.  Neck: Normal range of motion. Neck supple. No thyromegaly present.  Cardiovascular: Normal rate, regular rhythm, normal heart sounds and intact distal pulses.  No murmur heard. Pulmonary/Chest: Effort normal and breath sounds normal. No respiratory distress.  Musculoskeletal: She exhibits no edema.  Lymphadenopathy:    She has no cervical adenopathy.  Neurological: She is alert and oriented to person, place, and time. She displays normal reflexes. No cranial nerve deficit. She exhibits normal muscle tone (no cogwheeling). Coordination normal.  No tremor noted on exam- resting or intention  Skin: Skin is warm and dry.  Psychiatric: She has a normal mood and affect. Her behavior is normal.  Vitals reviewed.         Assessment & Plan:  Tremor- new to provider, ongoing for pt.  Not seen on exam today.  Based on description, sounds as if she has an intention  tremor.  But given that she has been on Carbadopa-Levodopa for 15-20 yrs there is concern for masking of her sxs.  Refer to neuro.  Pt expressed understanding and is in agreement w/ plan.   Lack of concentration- new.  Pt feels this is related to her menopausal sxs.  Discussed starting Wellbutrin to improve sxs and monitoring closely.  Pt is in agreement w/ this.  Will follow.

## 2017-09-06 NOTE — Patient Instructions (Signed)
Follow up as needed or as scheduled We'll call you with your Neuro referral START the Wellbutrin once or twice daily for improved concentration Call with any questions or concerns Have a great summer!!

## 2017-09-10 ENCOUNTER — Encounter: Payer: Self-pay | Admitting: Neurology

## 2017-09-11 NOTE — Progress Notes (Signed)
Subjective:   Sandra Benitez was seen in consultation in the movement disorder clinic at the request of Birdie Riddle Aundra Millet, MD.  The evaluation is for tremor.  Tremor started approximately 5 years ago and involves the bilateral UE, but notes it most with the right hand.  She notes it when trying to pick up a french fry from the bag and really doesn't note it at any other time.  Her friend recently noticed it and it scared her.  She thought that maybe she was masking PD since she is on carbidopa/levodopa for RLS.     There is family hx of tremor in her paternal uncle and his daughter.    Affected by caffeine:  No. (2-3 cups coffee per day) Affected by alcohol:  Rarely drinks enough to know Affected by stress:  No. Affected by fatigue:  No. Spills soup if on spoon:  No. Spills glass of liquid if full:  No. Affects ADL's (tying shoes, brushing teeth, etc):  No.  Current/Previously tried tremor medications: n/a  Current medications that may exacerbate tremor:  n/a  Outside reports reviewed: historical medical records, lab reports, office notes and referral letter/letters.  Is on carbidopa/levodopa and has been on this for over 20 years for RLS.  Takes carbidopa/levodopa 25/100, 1 po q hs for this.   She will take it prn if going to theatre/massage, etc.  She hasn't been on other meds for that.    Allergies  Allergen Reactions  . Penicillins     Has patient had a PCN reaction causing immediate rash, facial/tongue/throat swelling, SOB or lightheadedness with hypotension: YES Has patient had a PCN reaction causing severe rash involving mucus membranes or skin necrosis: NO Has patient had a PCN reaction that required hospitalization NO Has patient had a PCN reaction occurring within the last 10 years: NO If all of the above answers are "NO", then may proceed with Cephalosporin use.  . Other     Fluoroquinolone--- Rash   . Bactrim [Sulfamethoxazole-Trimethoprim] Rash    Outpatient Encounter  Medications as of 09/14/2017  Medication Sig  . ALPRAZolam (XANAX) 0.25 MG tablet Take 1 tablet (0.25 mg total) by mouth 3 (three) times daily. (Patient taking differently: Take 0.25 mg by mouth as needed. )  . buPROPion (WELLBUTRIN) 75 MG tablet Take 1 tablet (75 mg total) by mouth 2 (two) times daily.  . carbidopa-levodopa (SINEMET IR) 25-100 MG tablet Take 2 tablets by mouth at bedtime.  . chlorpheniramine (CHLOR-TRIMETON) 4 MG tablet Take 2 tablets (8 mg total) by mouth 3 (three) times daily. (Patient taking differently: Take 8 mg by mouth daily. )  . escitalopram (LEXAPRO) 5 MG tablet Take 1 tablet (5 mg total) by mouth daily.  . fluticasone (FLONASE) 50 MCG/ACT nasal spray USE 2 SPRAYS IN EACH  NOSTRIL DAILY  . levonorgestrel (MIRENA) 20 MCG/24HR IUD 1 each by Intrauterine route once.  . meloxicam (MOBIC) 15 MG tablet Take 1 tablet (15 mg total) by mouth daily.  . metoprolol tartrate (LOPRESSOR) 25 MG tablet Take 12.5 mg by mouth daily.   . [DISCONTINUED] Multiple Vitamins-Minerals (MULTIVITAMIN ADULT PO) multivitamin   No facility-administered encounter medications on file as of 09/14/2017.     Past Medical History:  Diagnosis Date  . Anemia   . IBS (irritable bowel syndrome)   . Sleep apnea   . Yeast infection     Past Surgical History:  Procedure Laterality Date  . FACIAL COSMETIC SURGERY      Social  History   Socioeconomic History  . Marital status: Divorced    Spouse name: Not on file  . Number of children: Not on file  . Years of education: Not on file  . Highest education level: Not on file  Occupational History  . Not on file  Social Needs  . Financial resource strain: Not on file  . Food insecurity:    Worry: Not on file    Inability: Not on file  . Transportation needs:    Medical: Not on file    Non-medical: Not on file  Tobacco Use  . Smoking status: Never Smoker  . Smokeless tobacco: Never Used  Substance and Sexual Activity  . Alcohol use: Yes     Comment: Occasional  . Drug use: No  . Sexual activity: Not Currently    Birth control/protection: IUD    Comment: MIRENA inserted 09-20-09  Lifestyle  . Physical activity:    Days per week: Not on file    Minutes per session: Not on file  . Stress: Not on file  Relationships  . Social connections:    Talks on phone: Not on file    Gets together: Not on file    Attends religious service: Not on file    Active member of club or organization: Not on file    Attends meetings of clubs or organizations: Not on file    Relationship status: Not on file  . Intimate partner violence:    Fear of current or ex partner: Not on file    Emotionally abused: Not on file    Physically abused: Not on file    Forced sexual activity: Not on file  Other Topics Concern  . Not on file  Social History Narrative  . Not on file    Family Status  Relation Name Status  . MGF  Deceased  . Father  Deceased  . Brother  Deceased  . PGF  Deceased  . PGM  Deceased  . MGM  Deceased  . Mother  Alive  . Brother  Deceased    Review of Systems ROS   Objective:   VITALS:   Vitals:   09/14/17 0943  BP: 110/66  Pulse: 64  SpO2: 98%  Weight: 169 lb (76.7 kg)  Height: 5' 4.5" (1.638 m)   Gen:  Appears stated age and in NAD. HEENT:  Normocephalic, atraumatic. The mucous membranes are moist. The superficial temporal arteries are without ropiness or tenderness. Cardiovascular: Regular rate and rhythm. Lungs: Clear to auscultation bilaterally. Neck: There are no carotid bruits noted bilaterally.  NEUROLOGICAL:  Orientation:  The patient is alert and oriented x 3.  Recent and remote memory are intact.  Attention span and concentration are normal.  Able to name objects and repeat without trouble.  Fund of knowledge is appropriate Cranial nerves: There is good facial symmetry. The pupils are equal round and reactive to light bilaterally. Fundoscopic exam reveals clear disc margins bilaterally.  Extraocular muscles are intact and visual fields are full to confrontational testing. Speech is fluent and clear. Soft palate rises symmetrically and there is no tongue deviation. Hearing is intact to conversational tone. Tone: Tone is good throughout. Sensation: Sensation is intact to light touch and pinprick throughout (facial, trunk, extremities). Vibration is intact at the bilateral big toe. There is no extinction with double simultaneous stimulation. There is no sensory dermatomal level identified. Coordination:  The patient has no dysdiadichokinesia or dysmetria. Motor: Strength is 5/5 in the bilateral upper  and lower extremities.  Shoulder shrug is equal bilaterally.  There is no pronator drift.  There are no fasciculations noted. DTR's: Deep tendon reflexes are 2/4 at the bilateral biceps, triceps, brachioradialis, patella and achilles.  Plantar responses are downgoing bilaterally. Gait and Station: The patient is able to ambulate without difficulty. The patient is able to heel toe walk without any difficulty. The patient is able to ambulate in a tandem fashion. The patient is able to stand in the Romberg position.   MOVEMENT EXAM: Tremor:  There is no postural tremor.  Very, very minor intention tremor. The patient is able to draw Archimedes spirals without significant difficulty.  There is no tremor at rest.  The patient is able to pour water from one glass to another without spilling it.   Labs:   Chemistry      Component Value Date/Time   NA 138 06/01/2016 1025   K 4.9 06/01/2016 1025   CL 104 06/01/2016 1025   CO2 30 06/01/2016 1025   BUN 15 06/01/2016 1025   CREATININE 0.64 06/01/2016 1025      Component Value Date/Time   CALCIUM 9.7 06/01/2016 1025   ALKPHOS 80 06/01/2016 1025   AST 14 06/01/2016 1025   ALT 20 06/01/2016 1025   BILITOT 0.5 06/01/2016 1025     Lab Results  Component Value Date   TSH 1.63 06/01/2016      Assessment/Plan:   1.    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  She was reassured that this is not Parkinson's disease, which she was worried about.  I saw no evidence of a neurodegenerative disorder.  No medication is necessary.  Patient education was provided.  2.  RLS  -on carbidopa/levodopa 25/100, 1 po q hs for 20 years.  While this certainly would not be a first line choice of meds for me, there is some data to suggest that patients without PD will not develop dyskinesia over the long term if taking this.  The medication is working for her, and I really would not change it unless it stops working.  -needs labs to r/o other causes.  Will do cbc, tsh, ferritin, iron stores.  Will order those today.  3.  Follow-up as needed.  CC:  Midge Minium, MD

## 2017-09-13 ENCOUNTER — Ambulatory Visit: Payer: 59 | Admitting: Internal Medicine

## 2017-09-14 ENCOUNTER — Encounter: Payer: Self-pay | Admitting: Neurology

## 2017-09-14 ENCOUNTER — Ambulatory Visit (INDEPENDENT_AMBULATORY_CARE_PROVIDER_SITE_OTHER): Payer: 59 | Admitting: Neurology

## 2017-09-14 ENCOUNTER — Other Ambulatory Visit: Payer: 59

## 2017-09-14 VITALS — BP 110/66 | HR 64 | Ht 64.5 in | Wt 169.0 lb

## 2017-09-14 DIAGNOSIS — G2581 Restless legs syndrome: Secondary | ICD-10-CM

## 2017-09-14 DIAGNOSIS — D509 Iron deficiency anemia, unspecified: Secondary | ICD-10-CM

## 2017-09-14 DIAGNOSIS — R251 Tremor, unspecified: Secondary | ICD-10-CM

## 2017-09-14 DIAGNOSIS — G252 Other specified forms of tremor: Secondary | ICD-10-CM | POA: Diagnosis not present

## 2017-09-14 LAB — CBC WITH DIFFERENTIAL/PLATELET
BASOS PCT: 0.8 %
Basophils Absolute: 49 cells/uL (ref 0–200)
EOS PCT: 1.3 %
Eosinophils Absolute: 79 cells/uL (ref 15–500)
HCT: 39.4 % (ref 35.0–45.0)
HEMOGLOBIN: 13.6 g/dL (ref 11.7–15.5)
Lymphs Abs: 2019 cells/uL (ref 850–3900)
MCH: 29.8 pg (ref 27.0–33.0)
MCHC: 34.5 g/dL (ref 32.0–36.0)
MCV: 86.4 fL (ref 80.0–100.0)
MONOS PCT: 6.9 %
MPV: 9.3 fL (ref 7.5–12.5)
NEUTROS ABS: 3532 {cells}/uL (ref 1500–7800)
Neutrophils Relative %: 57.9 %
PLATELETS: 385 10*3/uL (ref 140–400)
RBC: 4.56 10*6/uL (ref 3.80–5.10)
RDW: 12.6 % (ref 11.0–15.0)
TOTAL LYMPHOCYTE: 33.1 %
WBC mixed population: 421 cells/uL (ref 200–950)
WBC: 6.1 10*3/uL (ref 3.8–10.8)

## 2017-09-14 LAB — IRON,TIBC AND FERRITIN PANEL
%SAT: 30 % (ref 11–50)
FERRITIN: 97 ng/mL (ref 10–232)
Iron: 103 ug/dL (ref 45–160)
TIBC: 345 mcg/dL (calc) (ref 250–450)

## 2017-09-14 LAB — TSH: TSH: 1.21 m[IU]/L

## 2017-09-14 NOTE — Patient Instructions (Signed)
1. Your provider has requested that you have labwork completed today. Please go to [facility] ([id number]) on the second floor of this building before leaving the office today. You do not need to check in. If you are not called within 15 minutes please check with the front desk.   

## 2017-09-17 ENCOUNTER — Telehealth: Payer: Self-pay | Admitting: Neurology

## 2017-09-17 NOTE — Telephone Encounter (Signed)
-----   Message from [place], DO sent at 09/17/2017  7:26 AM EDT ----- I have reviewed all lab results which are normal or stable. Please inform the patient.

## 2017-09-17 NOTE — Telephone Encounter (Signed)
Mychart message sent to patient.

## 2017-09-19 ENCOUNTER — Other Ambulatory Visit: Payer: Self-pay | Admitting: Family Medicine

## 2017-09-20 ENCOUNTER — Other Ambulatory Visit: Payer: Self-pay | Admitting: General Practice

## 2017-09-20 MED ORDER — BUPROPION HCL 75 MG PO TABS: 75.0000 mg | ORAL_TABLET | Freq: Two times a day (BID) | ORAL | 1 refills | Status: DC

## 2017-09-27 DIAGNOSIS — L570 Actinic keratosis: Secondary | ICD-10-CM | POA: Diagnosis not present

## 2017-09-28 DIAGNOSIS — G4733 Obstructive sleep apnea (adult) (pediatric): Secondary | ICD-10-CM | POA: Diagnosis not present

## 2017-10-08 DIAGNOSIS — L814 Other melanin hyperpigmentation: Secondary | ICD-10-CM | POA: Diagnosis not present

## 2017-10-08 DIAGNOSIS — L821 Other seborrheic keratosis: Secondary | ICD-10-CM | POA: Diagnosis not present

## 2017-10-08 DIAGNOSIS — D1800 Hemangioma unspecified site: Secondary | ICD-10-CM | POA: Diagnosis not present

## 2017-10-29 DIAGNOSIS — G4733 Obstructive sleep apnea (adult) (pediatric): Secondary | ICD-10-CM | POA: Diagnosis not present

## 2017-10-30 ENCOUNTER — Other Ambulatory Visit: Payer: Self-pay | Admitting: Family Medicine

## 2017-11-23 ENCOUNTER — Encounter: Payer: Self-pay | Admitting: Family Medicine

## 2017-11-28 DIAGNOSIS — G4733 Obstructive sleep apnea (adult) (pediatric): Secondary | ICD-10-CM | POA: Diagnosis not present

## 2017-12-12 DIAGNOSIS — N951 Menopausal and female climacteric states: Secondary | ICD-10-CM | POA: Diagnosis not present

## 2017-12-28 DIAGNOSIS — G4733 Obstructive sleep apnea (adult) (pediatric): Secondary | ICD-10-CM | POA: Diagnosis not present

## 2017-12-29 ENCOUNTER — Other Ambulatory Visit: Payer: Self-pay | Admitting: Pulmonary Disease

## 2018-01-01 ENCOUNTER — Other Ambulatory Visit: Payer: Self-pay | Admitting: Family Medicine

## 2018-01-03 DIAGNOSIS — M79671 Pain in right foot: Secondary | ICD-10-CM | POA: Diagnosis not present

## 2018-01-03 DIAGNOSIS — M79672 Pain in left foot: Secondary | ICD-10-CM | POA: Diagnosis not present

## 2018-01-10 ENCOUNTER — Ambulatory Visit (INDEPENDENT_AMBULATORY_CARE_PROVIDER_SITE_OTHER): Payer: 59 | Admitting: Family Medicine

## 2018-01-10 ENCOUNTER — Encounter: Payer: Self-pay | Admitting: Family Medicine

## 2018-01-10 ENCOUNTER — Other Ambulatory Visit: Payer: Self-pay

## 2018-01-10 VITALS — BP 110/78 | HR 69 | Temp 97.9°F | Resp 14 | Ht 65.0 in | Wt 174.0 lb

## 2018-01-10 DIAGNOSIS — Z0000 Encounter for general adult medical examination without abnormal findings: Secondary | ICD-10-CM | POA: Diagnosis not present

## 2018-01-10 DIAGNOSIS — E663 Overweight: Secondary | ICD-10-CM | POA: Diagnosis not present

## 2018-01-10 MED ORDER — CARBIDOPA-LEVODOPA 25-100 MG PO TABS: 2.0000 | ORAL_TABLET | Freq: Every day | ORAL | 1 refills | Status: DC

## 2018-01-10 NOTE — Assessment & Plan Note (Signed)
Pt's PE WNL.  UTD on GYN, colonoscopy, immunizations.  Stressed need for healthy diet and regular exercise.  Check labs.  Anticipatory guidance provided.

## 2018-01-10 NOTE — Patient Instructions (Addendum)
Follow up in 1 year or as needed We'll notify you of your lab results and make any changes if needed Keep up the good work on healthy diet and regular exercise- you can do it!! Call with any questions or concerns Happy Fall!!!

## 2018-01-10 NOTE — Progress Notes (Signed)
   Subjective:    Patient ID: Sandra Benitez, female    DOB: [birth year], 57 y.o.   MRN: [id number]  HPI Physical exam- UTD on pap, colonoscopy, mammo.  UTD on flu, Tdap.   Review of Systems Patient reports no vision/ hearing changes, adenopathy,fever, weight change,  persistant/recurrent hoarseness , swallowing issues, chest pain, palpitations, edema, persistant/recurrent cough, hemoptysis, dyspnea (rest/exertional/paroxysmal nocturnal), gastrointestinal bleeding (melena, rectal bleeding), abdominal pain, significant heartburn, bowel changes, GU symptoms (dysuria, hematuria, incontinence), Gyn symptoms (abnormal  bleeding, pain),  syncope, focal weakness, memory loss, numbness & tingling, skin/hair/nail changes, abnormal bruising or bleeding, anxiety, or depression.     Objective:   Physical Exam General Appearance:    Alert, cooperative, no distress, appears stated age  Head:    Normocephalic, without obvious abnormality, atraumatic  Eyes:    PERRL, conjunctiva/corneas clear, EOM's intact, fundi    benign, both eyes  Ears:    Normal TM's and external ear canals, both ears  Nose:   Nares normal, septum midline, mucosa normal, no drainage    or sinus tenderness  Throat:   Lips, mucosa, and tongue normal; teeth and gums normal  Neck:   Supple, symmetrical, trachea midline, no adenopathy;    Thyroid: no enlargement/tenderness/nodules  Back:     Symmetric, no curvature, ROM normal, no CVA tenderness  Lungs:     Clear to auscultation bilaterally, respirations unlabored  Chest Wall:    No tenderness or deformity   Heart:    Regular rate and rhythm, S1 and S2 normal, no murmur, rub   or gallop  Breast Exam:    Deferred to GYN  Abdomen:     Soft, non-tender, bowel sounds active all four quadrants,    no masses, no organomegaly  Genitalia:    Deferred to GYN  Rectal:    Extremities:   Extremities normal, atraumatic, no cyanosis or edema  Pulses:   2+ and symmetric all extremities  Skin:   Skin  color, texture, turgor normal, no rashes or lesions  Lymph nodes:   Cervical, supraclavicular, and axillary nodes normal  Neurologic:   CNII-XII intact, normal strength, sensation and reflexes    throughout          Assessment & Plan:

## 2018-01-11 LAB — CBC WITH DIFFERENTIAL/PLATELET
BASOS ABS: 0.1 10*3/uL (ref 0.0–0.1)
Basophils Relative: 1 % (ref 0.0–3.0)
EOS ABS: 0.1 10*3/uL (ref 0.0–0.7)
EOS PCT: 2.1 % (ref 0.0–5.0)
HCT: 41 % (ref 36.0–46.0)
HEMOGLOBIN: 13.5 g/dL (ref 12.0–15.0)
Lymphocytes Relative: 35.5 % (ref 12.0–46.0)
Lymphs Abs: 2.1 10*3/uL (ref 0.7–4.0)
MCHC: 32.9 g/dL (ref 30.0–36.0)
MCV: 91 fl (ref 78.0–100.0)
MONO ABS: 0.3 10*3/uL (ref 0.1–1.0)
Monocytes Relative: 5.9 % (ref 3.0–12.0)
NEUTROS PCT: 55.5 % (ref 43.0–77.0)
Neutro Abs: 3.2 10*3/uL (ref 1.4–7.7)
PLATELETS: 439 10*3/uL — AB (ref 150.0–400.0)
RBC: 4.51 Mil/uL (ref 3.87–5.11)
RDW: 13.2 % (ref 11.5–15.5)
WBC: 5.8 10*3/uL (ref 4.0–10.5)

## 2018-01-11 LAB — HEPATIC FUNCTION PANEL
ALT: 15 U/L (ref 0–35)
AST: 13 U/L (ref 0–37)
Albumin: 4.5 g/dL (ref 3.5–5.2)
Alkaline Phosphatase: 94 U/L (ref 39–117)
BILIRUBIN DIRECT: 0 mg/dL (ref 0.0–0.3)
Total Bilirubin: 0.3 mg/dL (ref 0.2–1.2)
Total Protein: 7.2 g/dL (ref 6.0–8.3)

## 2018-01-11 LAB — BASIC METABOLIC PANEL
BUN: 23 mg/dL (ref 6–23)
CALCIUM: 9.6 mg/dL (ref 8.4–10.5)
CHLORIDE: 105 meq/L (ref 96–112)
CO2: 26 meq/L (ref 19–32)
Creatinine, Ser: 0.72 mg/dL (ref 0.40–1.20)
GFR: 89.25 mL/min (ref >60.00)
Glucose, Bld: 96 mg/dL (ref 70–99)
Potassium: 4.3 mEq/L (ref 3.5–5.1)
SODIUM: 140 meq/L (ref 135–145)

## 2018-01-11 LAB — LIPID PANEL
CHOL/HDL RATIO: 5
Cholesterol: 212 mg/dL — ABNORMAL HIGH (ref 0–200)
HDL: 43.1 mg/dL (ref >39.00)
LDL Cholesterol: 133 mg/dL — ABNORMAL HIGH (ref 0–99)
NONHDL: 169.07
Triglycerides: 182 mg/dL — ABNORMAL HIGH (ref 0.0–149.0)
VLDL: 36.4 mg/dL (ref 0.0–40.0)

## 2018-01-11 LAB — TSH: TSH: 1.5 u[IU]/mL (ref 0.35–4.50)

## 2018-01-12 DIAGNOSIS — Z23 Encounter for immunization: Secondary | ICD-10-CM | POA: Diagnosis not present

## 2018-01-17 ENCOUNTER — Other Ambulatory Visit: Payer: Self-pay | Admitting: Family Medicine

## 2018-01-17 NOTE — Telephone Encounter (Signed)
Last OV 01/10/18 Alprazolam last filled 06/01/16 #60 with 0

## 2018-01-20 DIAGNOSIS — J020 Streptococcal pharyngitis: Secondary | ICD-10-CM | POA: Diagnosis not present

## 2018-01-28 DIAGNOSIS — G4733 Obstructive sleep apnea (adult) (pediatric): Secondary | ICD-10-CM | POA: Diagnosis not present

## 2018-02-06 ENCOUNTER — Other Ambulatory Visit: Payer: Self-pay | Admitting: General Practice

## 2018-02-06 ENCOUNTER — Encounter: Payer: Self-pay | Admitting: General Practice

## 2018-02-06 NOTE — Telephone Encounter (Signed)
Last OV 01/10/18 Alprazolam last filled 01/17/18 #60 with 1

## 2018-02-06 NOTE — Telephone Encounter (Signed)
mychart sent to pt to advise.

## 2018-02-06 NOTE — Telephone Encounter (Signed)
Pt was given #60 w/ 1 refill on 10/17- should have a refill remaining at pharmacy

## 2018-02-27 DIAGNOSIS — G4733 Obstructive sleep apnea (adult) (pediatric): Secondary | ICD-10-CM | POA: Diagnosis not present

## 2018-03-07 ENCOUNTER — Encounter: Payer: Self-pay | Admitting: Internal Medicine

## 2018-03-07 ENCOUNTER — Ambulatory Visit (INDEPENDENT_AMBULATORY_CARE_PROVIDER_SITE_OTHER): Payer: 59 | Admitting: Internal Medicine

## 2018-03-07 VITALS — BP 120/66 | HR 58 | Ht 65.0 in | Wt 178.0 lb

## 2018-03-07 DIAGNOSIS — G4733 Obstructive sleep apnea (adult) (pediatric): Secondary | ICD-10-CM

## 2018-03-07 DIAGNOSIS — G2581 Restless legs syndrome: Secondary | ICD-10-CM | POA: Diagnosis not present

## 2018-03-07 NOTE — Progress Notes (Signed)
HPI female never smoker followed for OSA,  restless leg syndrome, complicated by anemia, anxiety, IBS Unattended Home Sleep Test-11/10/2015-severe obstructive sleep apnea-AHI 46.6/hour, desaturation to 81%, body weight 168 pounds -----------------------------------------------------------------------------------------------------------------.  09/11/16- 57 year old female never smoker followed for OSA,  restless leg syndrome CPAP auto 5-20/[facility] OSA patient is sleeping abouth 7 to 8 hours a night now before she had facical surgery and then a cough is why she didn't use the CPAP machine  She was evaluated by Dr.Mannam for cough and told to resume CPAP when he saw her in April. He had her use chlorpheniramine and Flonase for cough. Sinemet for restless leg syndrome download confirms excellent control when used and she is using CPAP regularly again starting this week after recovery from facial plastic surgery and her cough. She is very comfortable with the pressure range.  03/07/18- 57 year old female never smoker followed for OSA,  restless leg syndrome CPAP auto 5-20/[facility] -----OSA: [facility]; Pt wears CPAP nightly and uses travel CPAP when outside of the home. Pt feels like the pressure may be too high when she wakes at times during the night. DL.  Sinemet 25-100 Leg symptoms have been well controlled with Sinemet. CPAP does well but sometimes pressure feels too high if mask dislodged at night. Download compliance percent, AHI 1.4/hour sometimes she uses her travel machine.  Sleeping well. No personal issue with tremor or memory. There is family hx of dementia and parkinsons- uncle/ GF.  ROS-see HPI    + = positive Constitutional:    weight loss, night sweats, fevers, chills, fatigue, lassitude. HEENT:    headaches, difficulty swallowing, tooth/dental problems, sore throat,       sneezing, itching, ear ache, nasal congestion, post nasal drip, snoring CV:    chest pain, orthopnea, PND,  swelling in lower extremities, anasarca,                                                     dizziness, palpitations Resp:   shortness of breath with exertion or at rest.                productive cough,   non-productive cough, coughing up of blood.              change in color of mucus.  wheezing.   Skin:    rash or lesions. GI:  No-   heartburn, indigestion, abdominal pain, nausea, vomiting, diarrhea,                 change in bowel habits, loss of appetite GU: dysuria, change in color of urine, no urgency or frequency.   flank pain. MS:   joint pain, stiffness, decreased range of motion, back pain. Neuro-     nothing unusual Psych:  change in mood or affect.  depression or anxiety.   memory loss.  OBJ- Physical Exam General- Alert, Oriented, Affect-appropriate, Distress- none acute, + overweight Skin- rash-none, lesions- none, excoriation- none Lymphadenopathy- none Head- atraumatic            Eyes- Gross vision intact, PERRLA, conjunctivae and secretions clear            Ears- Hearing, canals-normal            Nose- Clear, no-Septal dev, mucus, polyps, erosion, perforation  Throat- Mallampati III-IV , mucosa clear , drainage- none, tonsils- atrophic Neck- flexible , trachea midline, no stridor , thyroid nl, carotid no bruit Chest - symmetrical excursion , unlabored           Heart/CV- RRR , no murmur , no gallop  , no rub, nl s1 s2                           - JVD- none , edema- none, stasis changes- none, varices- none           Lung- clear to P&A, wheeze- none, cough- none , dullness-none, rub- none           Chest wall-  Abd-  Br/ Gen/ Rectal- Not done, not indicated Extrem- cyanosis- none, clubbing, none, atrophy- none, strength- nl Neuro-not restlesslegs are not moving while she is here.

## 2018-03-07 NOTE — Patient Instructions (Signed)
We can continue CPAP 5-20, mask of choice, humidifier, supplies, AirView or download card  Please call if we can help

## 2018-03-27 ENCOUNTER — Other Ambulatory Visit: Payer: Self-pay | Admitting: Family Medicine

## 2018-03-28 NOTE — Telephone Encounter (Signed)
Is pt to be on this long term?

## 2018-03-29 DIAGNOSIS — R6889 Other general symptoms and signs: Secondary | ICD-10-CM | POA: Diagnosis not present

## 2018-03-29 DIAGNOSIS — G4733 Obstructive sleep apnea (adult) (pediatric): Secondary | ICD-10-CM | POA: Diagnosis not present

## 2018-04-03 DIAGNOSIS — J40 Bronchitis, not specified as acute or chronic: Secondary | ICD-10-CM | POA: Diagnosis not present

## 2018-04-06 DIAGNOSIS — Z719 Counseling, unspecified: Secondary | ICD-10-CM | POA: Diagnosis not present

## 2018-04-09 DIAGNOSIS — Z719 Counseling, unspecified: Secondary | ICD-10-CM | POA: Diagnosis not present

## 2018-04-16 DIAGNOSIS — Z719 Counseling, unspecified: Secondary | ICD-10-CM | POA: Diagnosis not present

## 2018-04-22 ENCOUNTER — Other Ambulatory Visit: Payer: Self-pay | Admitting: Family Medicine

## 2018-04-23 ENCOUNTER — Other Ambulatory Visit: Payer: Self-pay | Admitting: General Practice

## 2018-04-23 NOTE — Telephone Encounter (Signed)
Last OV 01/10/18 (CPE) Alprazolam last filled 01/1718 #60 with 1  This is being requested by mail order. Ok to fill for 90 days? That would be #180

## 2018-04-24 MED ORDER — ALPRAZOLAM 0.25 MG PO TABS: 0.2500 mg | ORAL_TABLET | Freq: Three times a day (TID) | ORAL | 0 refills | Status: DC | PRN

## 2018-04-25 DIAGNOSIS — Z719 Counseling, unspecified: Secondary | ICD-10-CM | POA: Diagnosis not present

## 2018-04-26 DIAGNOSIS — Z1231 Encounter for screening mammogram for malignant neoplasm of breast: Secondary | ICD-10-CM | POA: Diagnosis not present

## 2018-04-26 DIAGNOSIS — Z6830 Body mass index (BMI) 30.0-30.9, adult: Secondary | ICD-10-CM | POA: Diagnosis not present

## 2018-04-26 DIAGNOSIS — Z79890 Hormone replacement therapy (postmenopausal): Secondary | ICD-10-CM | POA: Diagnosis not present

## 2018-04-26 DIAGNOSIS — Z01419 Encounter for gynecological examination (general) (routine) without abnormal findings: Secondary | ICD-10-CM | POA: Diagnosis not present

## 2018-04-26 LAB — HM MAMMOGRAPHY: HM MAMMO: NORMAL (ref 0–4)

## 2018-04-29 DIAGNOSIS — G4733 Obstructive sleep apnea (adult) (pediatric): Secondary | ICD-10-CM | POA: Diagnosis not present

## 2018-04-30 DIAGNOSIS — Z719 Counseling, unspecified: Secondary | ICD-10-CM | POA: Diagnosis not present

## 2018-05-02 ENCOUNTER — Encounter: Payer: Self-pay | Admitting: General Practice

## 2018-05-07 NOTE — Assessment & Plan Note (Signed)
She benefits from CPAP with improved sleep and control of snoring.  Download confirms excellent control. Plan-continue CPAP auto 5-20.  Discussed mask fit and leak.

## 2018-05-07 NOTE — Assessment & Plan Note (Signed)
Managed by her PCP. Sinemet 25-100 seems to work well.

## 2018-05-29 DIAGNOSIS — M545 Low back pain: Secondary | ICD-10-CM | POA: Diagnosis not present

## 2018-05-29 DIAGNOSIS — G4733 Obstructive sleep apnea (adult) (pediatric): Secondary | ICD-10-CM | POA: Diagnosis not present

## 2018-06-20 ENCOUNTER — Other Ambulatory Visit: Payer: Self-pay | Admitting: Family Medicine

## 2018-06-28 DIAGNOSIS — G4733 Obstructive sleep apnea (adult) (pediatric): Secondary | ICD-10-CM | POA: Diagnosis not present

## 2018-07-29 DIAGNOSIS — G4733 Obstructive sleep apnea (adult) (pediatric): Secondary | ICD-10-CM | POA: Diagnosis not present

## 2018-08-28 DIAGNOSIS — G4733 Obstructive sleep apnea (adult) (pediatric): Secondary | ICD-10-CM | POA: Diagnosis not present

## 2018-09-14 ENCOUNTER — Other Ambulatory Visit: Payer: Self-pay | Admitting: Family Medicine

## 2018-12-03 ENCOUNTER — Other Ambulatory Visit: Payer: Self-pay | Admitting: Family Medicine

## 2018-12-06 ENCOUNTER — Encounter: Payer: Self-pay | Admitting: Family Medicine

## 2018-12-06 MED ORDER — ESCITALOPRAM OXALATE 5 MG PO TABS: 5.0000 mg | ORAL_TABLET | Freq: Every day | ORAL | 1 refills | Status: DC

## 2018-12-09 ENCOUNTER — Other Ambulatory Visit: Payer: Self-pay | Admitting: Family Medicine

## 2018-12-10 ENCOUNTER — Encounter: Payer: Self-pay | Admitting: Family Medicine

## 2019-02-17 ENCOUNTER — Encounter: Payer: Self-pay | Admitting: Family Medicine

## 2019-02-28 ENCOUNTER — Other Ambulatory Visit: Payer: Self-pay | Admitting: Family Medicine

## 2019-03-09 IMAGING — DX DG CHEST 2V
2 series · 2 of 2 positions shown · non-contrast
Comparison: Chest radiograph 05/25/2015

CLINICAL DATA: Patient with persistent cough.

EXAM:
CHEST  2 VIEW

[chest pa]
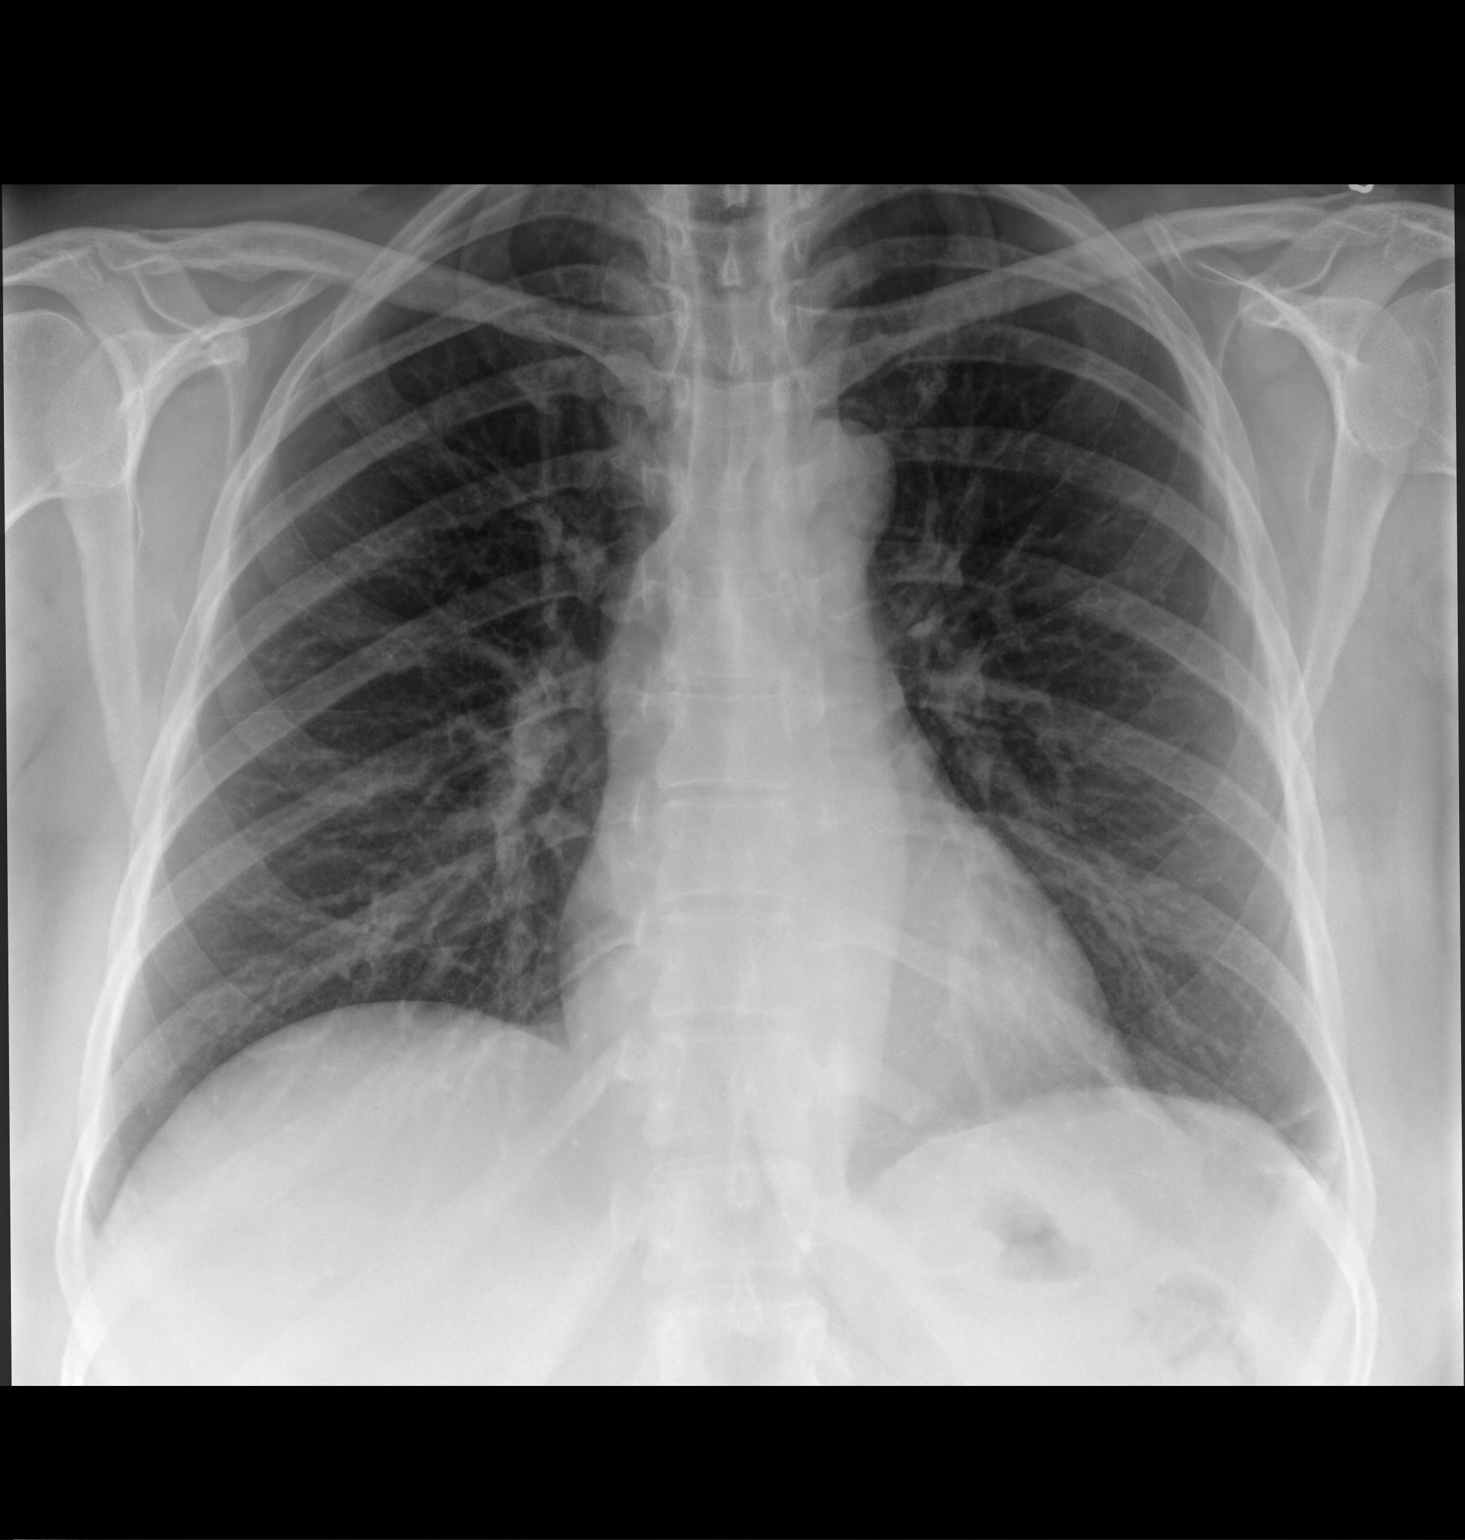

[chest lat]
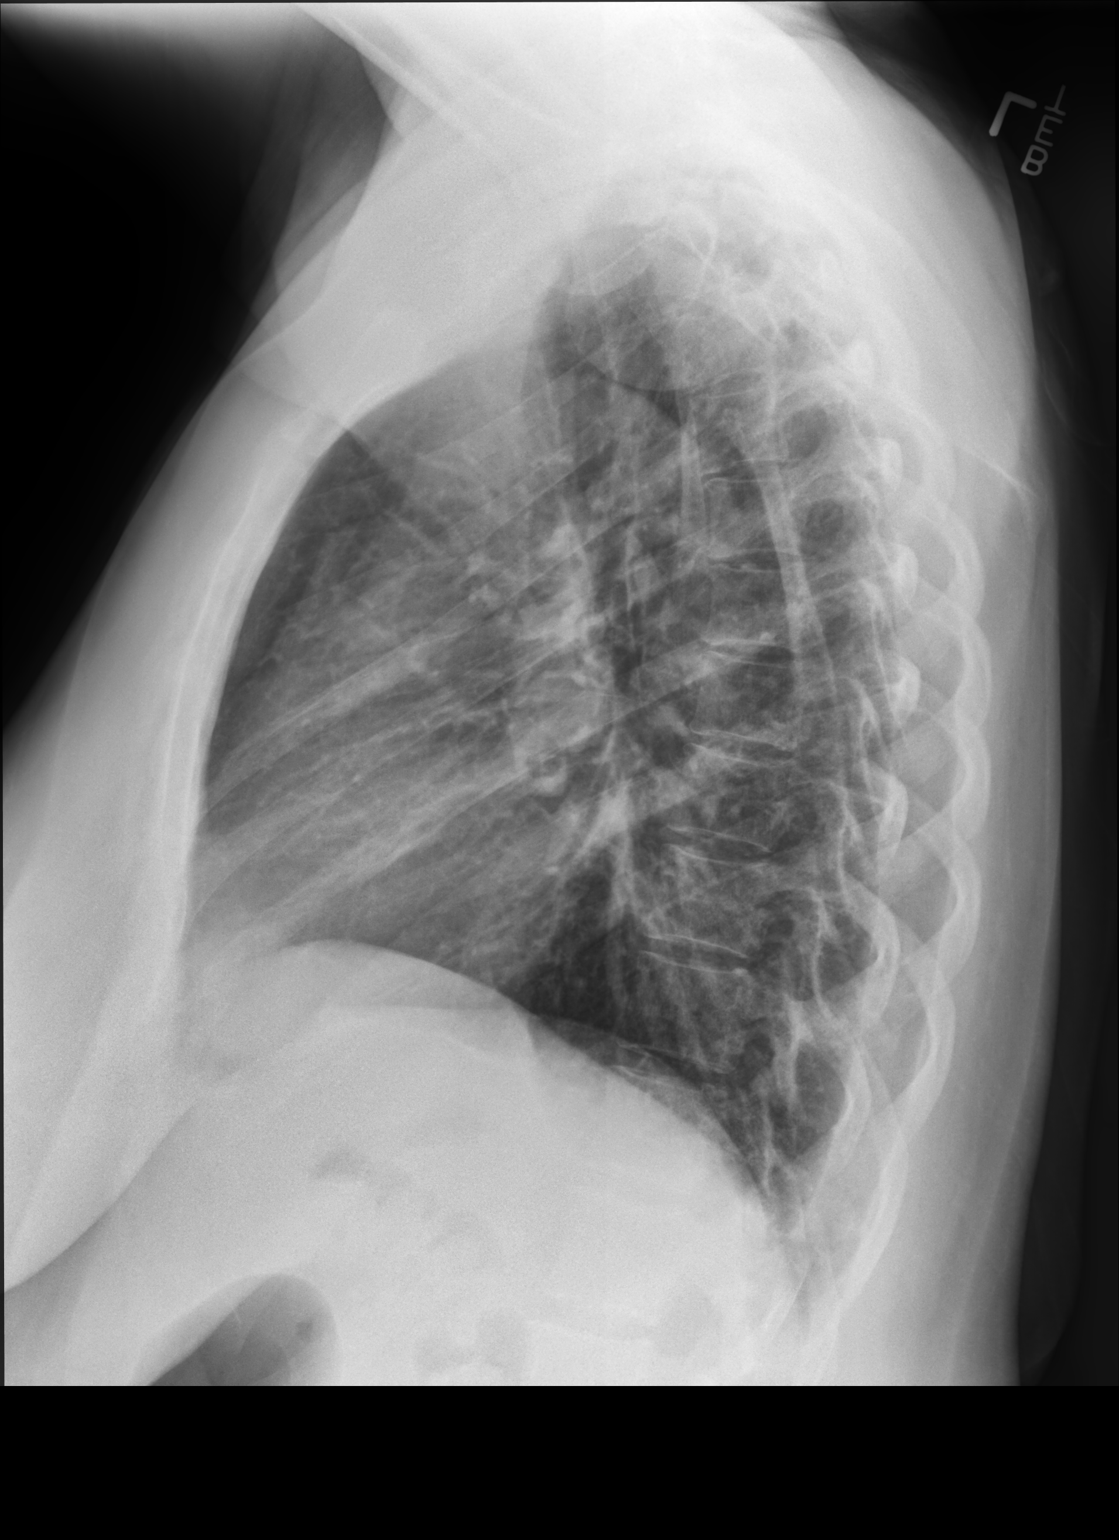

[2 of 2 positions shown; findings below may reference images not displayed]

FINDINGS: Stable cardiac and mediastinal contours. No consolidative pulmonary
opacities. No pleural effusion or pneumothorax. Mid thoracic spine
degenerative changes.
IMPRESSION: No active cardiopulmonary disease.

## 2019-03-10 ENCOUNTER — Other Ambulatory Visit: Payer: Self-pay

## 2019-03-10 ENCOUNTER — Ambulatory Visit (INDEPENDENT_AMBULATORY_CARE_PROVIDER_SITE_OTHER): Payer: 59 | Admitting: Internal Medicine

## 2019-03-10 DIAGNOSIS — J219 Acute bronchiolitis, unspecified: Secondary | ICD-10-CM | POA: Diagnosis not present

## 2019-03-10 DIAGNOSIS — G2581 Restless legs syndrome: Secondary | ICD-10-CM

## 2019-03-10 DIAGNOSIS — G4733 Obstructive sleep apnea (adult) (pediatric): Secondary | ICD-10-CM | POA: Diagnosis not present

## 2019-03-10 DIAGNOSIS — J209 Acute bronchitis, unspecified: Secondary | ICD-10-CM | POA: Diagnosis not present

## 2019-03-10 NOTE — Progress Notes (Signed)
HPI female never smoker followed for OSA,  restless leg syndrome, complicated by anemia, anxiety, IBS Unattended Home Sleep Test-11/10/2015-severe obstructive sleep apnea-AHI 46.6/hour, desaturation to 81%, body weight 168 pounds -----------------------------------------------------------------------------------------------------------------  03/07/18- 58 year old female never smoker followed for OSA,  restless leg syndrome CPAP auto 5-20/[facility] -----OSA: [facility]; Pt wears CPAP nightly and uses travel CPAP when outside of the home. Pt feels like the pressure may be too high when she wakes at times during the night. DL.  Sinemet 25-100 Leg symptoms have been well controlled with Sinemet. CPAP does well but sometimes pressure feels too high if mask dislodged at night. Download compliance percent, AHI 1.4/hour sometimes she uses her travel machine.  Sleeping well. No personal issue with tremor or memory. There is family hx of dementia and parkinsons- uncle/ GF.   03/10/2019- Virtual Visit via Telephone Note  I connected with Sandra Benitez on 03/10/19 at  1:30 PM EST by telephone and verified that I am speaking with the correct person using two identifiers.  Location: Patient: H Provider: O   I discussed the limitations, risks, security and privacy concerns of performing an evaluation and management service by telephone and the availability of in person appointments. I also discussed with the patient that there may be a patient responsible charge related to this service. The patient expressed understanding and agreed to proceed.   History of Present Illness: 58 year old female never smoker followed for OSA,  restless leg syndrome CPAP auto 5-20/[facility] >> today 5-15 Sinemet for Restless Legs 25-100 by PCP Presure occasionally too high. Restless legs controlled. Mentions persistent cough, typical for her last ing months after viral infections. Discussed potential trial of inhaler.     Observations/Objective: Download compliance 100%, AHI 1.6/ hr  Assessment and Plan: OSA- reduce to 5-15 Cough- post viral bronchitis. If doesn't clear, consider CXR, inhaler, maybe steroid.   Follow Up Instructions: 1 year   I discussed the assessment and treatment plan with the patient. The patient was provided an opportunity to ask questions and all were answered. The patient agreed with the plan and demonstrated an understanding of the instructions.   The patient was advised to call back or seek an in-person evaluation if the symptoms worsen or if the condition fails to improve as anticipated.  I provided 18 minutes of non-face-to-face time during this encounter.   Baird Lyons, MD    ROS-see HPI    + = positive Constitutional:    weight loss, night sweats, fevers, chills, fatigue, lassitude. HEENT:    headaches, difficulty swallowing, tooth/dental problems, sore throat,       sneezing, itching, ear ache, nasal congestion, post nasal drip, snoring CV:    chest pain, orthopnea, PND, swelling in lower extremities, anasarca,                                                     dizziness, palpitations Resp:   shortness of breath with exertion or at rest.                productive cough,   non-productive cough, coughing up of blood.              change in color of mucus.  wheezing.   Skin:    rash or lesions. GI:  No-   heartburn, indigestion, abdominal pain, nausea, vomiting, diarrhea,  change in bowel habits, loss of appetite GU: dysuria, change in color of urine, no urgency or frequency.   flank pain. MS:   joint pain, stiffness, decreased range of motion, back pain. Neuro-     nothing unusual Psych:  change in mood or affect.  depression or anxiety.   memory loss.  OBJ- Physical Exam General- Alert, Oriented, Affect-appropriate, Distress- none acute, + overweight Skin- rash-none, lesions- none, excoriation- none Lymphadenopathy- none Head- atraumatic             Eyes- Gross vision intact, PERRLA, conjunctivae and secretions clear            Ears- Hearing, canals-normal            Nose- Clear, no-Septal dev, mucus, polyps, erosion, perforation             Throat- Mallampati III-IV , mucosa clear , drainage- none, tonsils- atrophic Neck- flexible , trachea midline, no stridor , thyroid nl, carotid no bruit Chest - symmetrical excursion , unlabored           Heart/CV- RRR , no murmur , no gallop  , no rub, nl s1 s2                           - JVD- none , edema- none, stasis changes- none, varices- none           Lung- clear to P&A, wheeze- none, cough- none , dullness-none, rub- none           Chest wall-  Abd-  Br/ Gen/ Rectal- Not done, not indicated Extrem- cyanosis- none, clubbing, none, atrophy- none, strength- nl Neuro-not restlesslegs are not moving while she is here.

## 2019-03-10 NOTE — Patient Instructions (Signed)
Order- DME [facility] please change CPAP range to auto 5-15, continue mask of choice, humidifier, supplies, airview/ card  Please call if we can help

## 2019-03-11 ENCOUNTER — Encounter: Payer: Self-pay | Admitting: Internal Medicine

## 2019-03-11 NOTE — Assessment & Plan Note (Signed)
Recurrent and persistent after viral infections. Postviral irritant pattern. Consider CXR, steroids, inhaler trial as needed.

## 2019-03-11 NOTE — Assessment & Plan Note (Signed)
Benefits, but pressure gets too high Plan- reduce auto range to 5-15

## 2019-03-11 NOTE — Assessment & Plan Note (Signed)
Managed by PCP with Sinemet

## 2019-03-20 ENCOUNTER — Other Ambulatory Visit: Payer: Self-pay | Admitting: Gastroenterology

## 2019-03-31 ENCOUNTER — Other Ambulatory Visit: Payer: Self-pay | Admitting: Family Medicine

## 2019-03-31 ENCOUNTER — Other Ambulatory Visit: Payer: Self-pay | Admitting: Pulmonary Disease

## 2019-03-31 NOTE — Telephone Encounter (Signed)
Last OV 01/10/18 Alprazolam last filled 04/24/18 #180 with 0

## 2019-04-07 ENCOUNTER — Other Ambulatory Visit: Payer: Self-pay

## 2019-04-07 ENCOUNTER — Other Ambulatory Visit (HOSPITAL_COMMUNITY)
Admission: RE | Admit: 2019-04-07 | Discharge: 2019-04-07 | Disposition: A | Discharge status: Home / Self Care | Payer: 59 | Source: Ambulatory Visit | Attending: Gastroenterology | Admitting: Gastroenterology

## 2019-04-07 DIAGNOSIS — Z20822 Contact with and (suspected) exposure to covid-19: Secondary | ICD-10-CM | POA: Diagnosis not present

## 2019-04-07 DIAGNOSIS — Z01812 Encounter for preprocedural laboratory examination: Secondary | ICD-10-CM | POA: Diagnosis not present

## 2019-04-08 LAB — NOVEL CORONAVIRUS, NAA (HOSP ORDER, SEND-OUT TO REF LAB; TAT 18-24 HRS): SARS-CoV-2, NAA: NOT DETECTED

## 2019-04-09 NOTE — Anesthesia Preprocedure Evaluation (Addendum)
Anesthesia Evaluation  Patient identified by MRN, date of birth, ID band Patient awake    Reviewed: Allergy & Precautions, NPO status , Patient's Chart, lab work & pertinent test results  History of Anesthesia Complications Negative for: history of anesthetic complications  Airway Mallampati: IV  TM Distance: >3 FB Neck ROM: Full    Dental  (+) Dental Advisory Given   Pulmonary sleep apnea and Continuous Positive Airway Pressure Ventilation ,  04/07/2019 SARS CoV2 NEG   breath sounds clear to auscultation       Cardiovascular hypertension, Pt. on medications and Pt. on home beta blockers + dysrhythmias (PVCs)  Rhythm:Regular Rate:Normal     Neuro/Psych Anxiety Depression parkinson's    GI/Hepatic negative GI ROS, Neg liver ROS,   Endo/Other  obese  Renal/GU negative Renal ROS     Musculoskeletal   Abdominal (+) + obese,   Peds  Hematology negative hematology ROS (+)   Anesthesia Other Findings   Reproductive/Obstetrics IUD                            Anesthesia Physical Anesthesia Plan  ASA: III  Anesthesia Plan: MAC   Post-op Pain Management:    Induction:   PONV Risk Score and Plan: 2 and Treatment may vary due to age or medical condition  Airway Management Planned: Natural Airway and Nasal Cannula  Additional Equipment:   Intra-op Plan:   Post-operative Plan:   Informed Consent: I have reviewed the patients History and Physical, chart, labs and discussed the procedure including the risks, benefits and alternatives for the proposed anesthesia with the patient or authorized representative who has indicated his/her understanding and acceptance.     Dental advisory given  Plan Discussed with: CRNA and Surgeon  Anesthesia Plan Comments:        Anesthesia Quick Evaluation

## 2019-04-10 ENCOUNTER — Encounter (HOSPITAL_COMMUNITY): Payer: Self-pay | Admitting: Gastroenterology

## 2019-04-10 ENCOUNTER — Ambulatory Visit (HOSPITAL_COMMUNITY)
Admission: RE | Admit: 2019-04-10 | Discharge: 2019-04-10 | Disposition: A | Discharge status: Home / Self Care | Payer: 59 | Source: Ambulatory Visit | Attending: Gastroenterology | Admitting: Gastroenterology

## 2019-04-10 ENCOUNTER — Ambulatory Visit (HOSPITAL_COMMUNITY): Payer: 59 | Admitting: Anesthesiology

## 2019-04-10 ENCOUNTER — Other Ambulatory Visit: Payer: Self-pay

## 2019-04-10 ENCOUNTER — Encounter (HOSPITAL_COMMUNITY)
Admission: RE | Disposition: A | Discharge status: Home / Self Care | Payer: Self-pay | Source: Ambulatory Visit | Attending: Gastroenterology

## 2019-04-10 DIAGNOSIS — F329 Major depressive disorder, single episode, unspecified: Secondary | ICD-10-CM | POA: Insufficient documentation

## 2019-04-10 DIAGNOSIS — G20 Parkinson's disease: Secondary | ICD-10-CM | POA: Diagnosis not present

## 2019-04-10 DIAGNOSIS — Z881 Allergy status to other antibiotic agents status: Secondary | ICD-10-CM | POA: Insufficient documentation

## 2019-04-10 DIAGNOSIS — K5730 Diverticulosis of large intestine without perforation or abscess without bleeding: Secondary | ICD-10-CM | POA: Insufficient documentation

## 2019-04-10 DIAGNOSIS — I10 Essential (primary) hypertension: Secondary | ICD-10-CM | POA: Diagnosis not present

## 2019-04-10 DIAGNOSIS — Z880 Allergy status to penicillin: Secondary | ICD-10-CM | POA: Insufficient documentation

## 2019-04-10 DIAGNOSIS — G4730 Sleep apnea, unspecified: Secondary | ICD-10-CM | POA: Insufficient documentation

## 2019-04-10 DIAGNOSIS — F419 Anxiety disorder, unspecified: Secondary | ICD-10-CM | POA: Diagnosis not present

## 2019-04-10 DIAGNOSIS — K635 Polyp of colon: Secondary | ICD-10-CM | POA: Diagnosis not present

## 2019-04-10 DIAGNOSIS — Z791 Long term (current) use of non-steroidal anti-inflammatories (NSAID): Secondary | ICD-10-CM | POA: Insufficient documentation

## 2019-04-10 DIAGNOSIS — Z888 Allergy status to other drugs, medicaments and biological substances status: Secondary | ICD-10-CM | POA: Diagnosis not present

## 2019-04-10 DIAGNOSIS — Z882 Allergy status to sulfonamides status: Secondary | ICD-10-CM | POA: Insufficient documentation

## 2019-04-10 DIAGNOSIS — Z79899 Other long term (current) drug therapy: Secondary | ICD-10-CM | POA: Insufficient documentation

## 2019-04-10 DIAGNOSIS — K589 Irritable bowel syndrome without diarrhea: Secondary | ICD-10-CM | POA: Diagnosis not present

## 2019-04-10 DIAGNOSIS — D129 Benign neoplasm of anus and anal canal: Secondary | ICD-10-CM | POA: Insufficient documentation

## 2019-04-10 DIAGNOSIS — I493 Ventricular premature depolarization: Secondary | ICD-10-CM | POA: Insufficient documentation

## 2019-04-10 DIAGNOSIS — Z1211 Encounter for screening for malignant neoplasm of colon: Secondary | ICD-10-CM | POA: Insufficient documentation

## 2019-04-10 HISTORY — PX: BIOPSY: SHX5522

## 2019-04-10 HISTORY — PX: SCLEROTHERAPY: SHX6841

## 2019-04-10 HISTORY — PX: COLONOSCOPY WITH PROPOFOL: SHX5780

## 2019-04-10 HISTORY — PX: POLYPECTOMY: SHX5525

## 2019-04-10 SURGERY — COLONOSCOPY WITH PROPOFOL
Anesthesia: Monitor Anesthesia Care

## 2019-04-10 MED ORDER — EPINEPHRINE 1 MG/10ML IJ SOSY
PREFILLED_SYRINGE | INTRAMUSCULAR | Status: AC
  Filled 2019-04-10: qty 10

## 2019-04-10 MED ORDER — PROPOFOL 500 MG/50ML IV EMUL
INTRAVENOUS | Status: DC | PRN
  Administered 2019-04-10: 100 ug/kg/min via INTRAVENOUS

## 2019-04-10 MED ORDER — SODIUM CHLORIDE (PF) 0.9 % IJ SOLN
PREFILLED_SYRINGE | INTRAMUSCULAR | Status: DC | PRN
  Administered 2019-04-10: 4 mL

## 2019-04-10 MED ORDER — PROPOFOL 10 MG/ML IV BOLUS
INTRAVENOUS | Status: DC | PRN
  Administered 2019-04-10: 70 mg via INTRAVENOUS

## 2019-04-10 MED ORDER — LACTATED RINGERS IV SOLN
INTRAVENOUS | Status: DC
  Administered 2019-04-10: 07:00:00 1000 mL via INTRAVENOUS

## 2019-04-10 MED ORDER — SODIUM CHLORIDE 0.9 % IV SOLN: INTRAVENOUS | Status: DC

## 2019-04-10 MED ORDER — LIDOCAINE 2% (20 MG/ML) 5 ML SYRINGE
INTRAMUSCULAR | Status: DC | PRN
  Administered 2019-04-10: 60 mg via INTRAVENOUS

## 2019-04-10 SURGICAL SUPPLY — 21 items

## 2019-04-10 NOTE — H&P (Signed)
Sandra Benitez is an 59 y.o. female.   Chief Complaint: Colorectal cancer screening. HPI: 59 year old white female here for a screening colonoscopy. See office notes for details.  Past Medical History:  Diagnosis Date  . Anemia   . IBS (irritable bowel syndrome)   . PVC (premature ventricular contraction)   . Sleep apnea   . Yeast infection    Past Surgical History:  Procedure Laterality Date  . FACIAL COSMETIC SURGERY    . LASIK     Family History  Problem Relation Age of Onset  . Colon cancer Maternal Grandfather   . Heart disease Father   . Stroke Father   . Hypertension Father   . Hyperlipidemia Father   . Heart attack Father   . Neuropathy Father   . Heart disease Brother   . Stroke Brother   . Hypertension Brother   . Diverticulitis Mother   . Alcohol abuse Brother    Social History:  reports that she has never smoked. She has never used smokeless tobacco. She reports current alcohol use. She reports that she does not use drugs.  Allergies:  Allergies  Allergen Reactions  . Penicillins     Has patient had a PCN reaction causing immediate rash, facial/tongue/throat swelling, SOB or lightheadedness with hypotension: YES Has patient had a PCN reaction causing severe rash involving mucus membranes or skin necrosis: NO Has patient had a PCN reaction that required hospitalization NO Has patient had a PCN reaction occurring within the last 10 years: NO If all of the above answers are "NO", then may proceed with Cephalosporin use.  . Other     Fluoroquinolone--- Rash   . Sulfa Antibiotics Hives  . Bactrim [Sulfamethoxazole-Trimethoprim] Rash    Medications Prior to Admission  Medication Sig Dispense Refill  . ALPRAZolam (XANAX) 0.25 MG tablet TAKE 1 TABLET BY MOUTH 3  TIMES DAILY AS NEEDED FOR  ANXIETY (Patient taking differently: Take 0.125 mg by mouth at bedtime as needed for sleep. ) 180 tablet 0  . carbidopa-levodopa (SINEMET IR) 25-100 MG tablet TAKE 2 TABLETS BY  MOUTH AT  BEDTIME 180 tablet 3  . chlorpheniramine (CHLOR-TRIMETON) 4 MG tablet Take 8 mg by mouth at bedtime.    . Cholecalciferol (VITAMIN D3) 125 MCG (5000 UT) TABS Take 5,000 Units by mouth daily.    . diclofenac Sodium (VOLTAREN) 1 % GEL Apply 1 application topically 2 (two) times daily. Applied to right hand    . escitalopram (LEXAPRO) 5 MG tablet TAKE 1 TABLET BY MOUTH  DAILY (Patient taking differently: Take 5 mg by mouth daily. ) 90 tablet 3  . estradiol (VIVELLE-DOT) 0.05 MG/24HR patch Place 1 patch onto the skin 2 (two) times a week. Mondays & Thursdays.  3  . fluticasone (FLONASE) 50 MCG/ACT nasal spray USE 2 SPRAYS IN EACH  NOSTRIL DAILY 16 g 2  . levonorgestrel (MIRENA) 20 MCG/24HR IUD 1 each by Intrauterine route once.    . meloxicam (MOBIC) 15 MG tablet TAKE 1 TABLET BY MOUTH  DAILY (Patient taking differently: Take 15 mg by mouth every other day. In evening) 90 tablet 3  . metoprolol tartrate (LOPRESSOR) 25 MG tablet Take 12.5 mg by mouth daily.     . progesterone (PROMETRIUM) 100 MG capsule Take 100 mg by mouth every evening.     Marland Kitchen SUPREP BOWEL PREP KIT 17.5-3.13-1.6 GM/177ML SOLN Take 354 mLs by mouth once.     Review of Systems  Constitutional: Negative.   HENT: Negative.  Eyes: Negative.   Respiratory: Negative.   Cardiovascular: Negative.   Gastrointestinal: Negative.   Musculoskeletal: Negative.   Neurological: Negative.   Hematological: Negative.   Psychiatric/Behavioral: Positive for dysphoric mood. The patient is nervous/anxious.    Blood pressure (!) 141/78, pulse 66, temperature 98 F (36.7 C), temperature source Oral, resp. rate 19, height _0  (1.626 m), weight 82.6 kg, SpO2 96 %. Physical Exam  Constitutional: She is oriented to person, place, and time. She appears well-developed and well-nourished.  HENT:  Head: Normocephalic and atraumatic.  Eyes: Pupils are equal, round, and reactive to light. Conjunctivae and EOM are normal.  Cardiovascular: Normal  rate and regular rhythm.  Respiratory: Effort normal and breath sounds normal.  GI: Soft. Bowel sounds are normal.  Musculoskeletal:        General: Normal range of motion.     Cervical back: Normal range of motion and neck supple.  Neurological: She is alert and oriented to person, place, and time.  Skin: Skin is warm and dry.  Psychiatric: She has a normal mood and affect. Her behavior is normal. Judgment and thought content normal.    Assessment/Plan 1) Colorectal cancer screening: proceed with a colonoscopy at this time.  Juanita Craver, MD 04/10/2019, 7:21 AM

## 2019-04-10 NOTE — Op Note (Signed)
[facility] Patient Name: Sandra Benitez Procedure Date: 04/10/2019 MRN: [id number] Attending MD: Juanita Craver , MD Date of Birth: 1960-04-26 CSN: [id number] Age: 59 Admit Type: Outpatient Procedure:                Colonoscopy with cold buiopsy x 1 & snare                            polypectomy x 6. Indications:              CRC screening for colorectal malignant neoplasm. Providers:                Juanita Craver, MD, Glori Bickers, RN, Cherylynn Ridges,                            Technician, Eliberto Ivory, CRNA. Referring MD:             Aundra Millet. Birdie Riddle, MD, Delsa Bern, MD Medicines:                Monitored Anesthesia Care. Complications:            No immediate complications. Estimated Blood Loss:     Estimated blood loss was minimal. Procedure:                Pre-Anesthesia Assessment: - Prior to the                            procedure, a history and physical was performed,                            and patient medications and allergies were                            reviewed. The patient's tolerance of previous                            anesthesia was also reviewed. The risks and                            benefits of the procedure and the sedation options                            and risks were discussed with the patient. All                            questions were answered, and informed consent was                            obtained. Anticoagulants: The patient has taken                            Meloxicam 7 days before the procedure. ASA Grade                            Assessment: II After reviewing the risks and  benefits, the patient was deemed in satisfactory                            condition to undergo the procedure. After obtaining                            informed consent, the colonoscope was passed under                            direct vision. Throughout the procedure, the                            patient's blood  pressure, pulse, and oxygen                            saturations were monitored continuously. The                            CF-HQ190L ([id number]) Olympus colonoscope was                            introduced through the anus and advanced to the the                            terminal ileum, with identification of the                            appendiceal orifice and IC valve. The terminal                            ileum, the ileocecal valve, the appendiceal orifice                            and the rectum were photographed. The bowel                            preparation used was Suprep via split dose                            instruction. Scope In: 7:29:11 AM Scope Out: 7:58:29 AM Scope Withdrawal Time: 0 hours 20 minutes 31 seconds  Total Procedure Duration: 0 hours 29 minutes 18 seconds  Findings:      A 10 mm sessile villous appearing polyp was found in the distal rectum       at the anal verge; the base of the polyp was successfully injected with       4 mL of a 1:10,000 solution of epinephrine and blanching was noted in       the mucosa around the polyp; the polyp was removed with a hot snare x 6;       resection was complete, and retrieval was complete.      A small sessile polyp was found in the sigmoid colon-this was removed by       cold biopsy x 1.      A few medium-mouthed diverticula were found in the sigmoid  colon and       descending colon.      The terminal ileum appeared normal.      No additional abnormalities were found on retroflexion. Impression:               - One 10 mm sessile polyp in the distal rectum,                            removed with a hot snare x 6; resected and                            retrieved.                           - One small sessile polyp in the sigmoid                            colon-removed by cold biopsy x 1.                           - Few scattered diverticula in the sigmoid colon                            and in the  descending colon.                           - The examined portion of the ileum was normal. Moderate Sedation:      MAC used.. Recommendation:           - High fiber diet with augmented water consumption                            daily.                           - Continue present medications except for Meloxicam                           - Await pathology results.                           - Repeat colonoscopy in 3 years for surveillance.                           - Return to GI office PRN.                           - If the patient has any abnormal GI symptoms in                            the interim, she has been advised to call the                            office ASAP for further recommendations. Procedure Code(s):        --- Professional ---  45385, Colonoscopy, flexible; with removal of                            tumor(s), polyp(s), or other lesion(s) by snare                            technique                           45381, 59, Colonoscopy, flexible; with directed                            submucosal injection(s), any substance                           45380, 81, Colonoscopy, flexible; with biopsy,                            single or multiple Diagnosis Code(s):        --- Professional ---                           Z12.11, Encounter for screening for malignant                            neoplasm of colon                           D12.5, Benign neoplasm of sigmoid colon                           D12.8, Benign neoplasm of rectum                           K57.30, Diverticulosis of large intestine without                            perforation or abscess without bleeding                           Z86.010, Personal history of colonic polyps CPT copyright 2019 American [employer] Association. All rights reserved. The codes documented in this report are preliminary and upon coder review may  be revised to meet current compliance requirements. Juanita Craver, MD Juanita Craver, MD 04/10/2019 8:21:19 AM This report has been signed electronically. Number of Addenda: 0

## 2019-04-10 NOTE — Anesthesia Postprocedure Evaluation (Signed)
Anesthesia Post Note  Patient: Sandra Benitez  Procedure(s) Performed: COLONOSCOPY WITH PROPOFOL (N/A ) BIOPSY POLYPECTOMY SCLEROTHERAPY     Patient location during evaluation: Endoscopy Anesthesia Type: MAC Level of consciousness: awake and alert, patient cooperative and oriented Pain management: pain level controlled Vital Signs Assessment: post-procedure vital signs reviewed and stable Respiratory status: nonlabored ventilation, spontaneous breathing and respiratory function stable Cardiovascular status: blood pressure returned to baseline and stable Postop Assessment: no apparent nausea or vomiting and able to ambulate Anesthetic complications: no    Last Vitals:  Vitals:   04/10/19 0811 04/10/19 0820  BP:  (!) 107/47  Pulse: 92 87  Resp: 18 20  Temp:    SpO2: 95% 94%    Last Pain:  Vitals:   04/10/19 0828  TempSrc:   PainSc: 0-No pain                 Giorgia Wahler,E. Maalle Starrett

## 2019-04-10 NOTE — Transfer of Care (Signed)
Immediate Anesthesia Transfer of Care Note  Patient: Sandra Benitez  Procedure(s) Performed: Procedure(s): COLONOSCOPY WITH PROPOFOL (N/A) BIOPSY  Patient Location: PACU and Endoscopy Unit  Anesthesia Type:General  Level of Consciousness: awake, alert  and oriented  Airway & Oxygen Therapy: Patient Spontanous Breathing and Patient connected to nasal cannula oxygen  Post-op Assessment: Report given to RN and Post -op Vital signs reviewed and stable  Post vital signs: Reviewed and stable  Last Vitals:  Vitals:   04/10/19 0703  BP: (!) 141/78  Pulse: 66  Resp: 19  Temp: 36.7 C  SpO2: 0000000    Complications: No apparent anesthesia complications

## 2019-04-10 NOTE — Discharge Instructions (Signed)

## 2019-04-11 ENCOUNTER — Encounter: Payer: Self-pay | Admitting: *Deleted

## 2019-04-11 LAB — SURGICAL PATHOLOGY

## 2019-06-06 ENCOUNTER — Ambulatory Visit (INDEPENDENT_AMBULATORY_CARE_PROVIDER_SITE_OTHER): Payer: 59 | Admitting: Family Medicine

## 2019-06-06 ENCOUNTER — Encounter: Payer: Self-pay | Admitting: Family Medicine

## 2019-06-06 ENCOUNTER — Other Ambulatory Visit: Payer: Self-pay

## 2019-06-06 VITALS — BP 123/74 | HR 67 | Temp 97.8°F | Resp 16 | Ht 65.0 in | Wt 173.4 lb

## 2019-06-06 DIAGNOSIS — Z0000 Encounter for general adult medical examination without abnormal findings: Secondary | ICD-10-CM | POA: Diagnosis not present

## 2019-06-06 DIAGNOSIS — E663 Overweight: Secondary | ICD-10-CM

## 2019-06-06 NOTE — Patient Instructions (Signed)
Follow up in 1 year or as needed We'll notify you of your lab results and make any changes if needed Continue to work on healthy diet and regular exercise- you look great! Call with any questions or concerns Stay Safe!  Stay Healthy! 

## 2019-06-06 NOTE — Progress Notes (Signed)
   Subjective:    Patient ID: Sandra Benitez, female    DOB: September 02, 1960, 59 y.o.   MRN: [id number]  HPI CPE- UTD on pap (scheduled), mammo (having these every other year), colonoscopy, flu, and Tdap.  Pt is down 5 lbs since last visit.  Exercising regularly  Pt plans to get COVID vaccine   Review of Systems Patient reports no vision/ hearing changes, adenopathy,fever, persistant/recurrent hoarseness , swallowing issues, chest pain, palpitations, edema, persistant/recurrent cough, hemoptysis, dyspnea (rest/exertional/paroxysmal nocturnal), gastrointestinal bleeding (melena, rectal bleeding), abdominal pain, significant heartburn, bowel changes, GU symptoms (dysuria, hematuria, incontinence), Gyn symptoms (abnormal  bleeding, pain),  syncope, focal weakness, memory loss, numbness & tingling, skin/hair/nail changes, abnormal bruising or bleeding, anxiety, or depression.   This visit occurred during the SARS-CoV-2 public health emergency.  Safety protocols were in place, including screening questions prior to the visit, additional usage of staff PPE, and extensive cleaning of exam room while observing appropriate contact time as indicated for disinfecting solutions.       Objective:   Physical Exam General Appearance:    Alert, cooperative, no distress, appears stated age  Head:    Normocephalic, without obvious abnormality, atraumatic  Eyes:    PERRL, conjunctiva/corneas clear, EOM's intact, fundi    benign, both eyes  Ears:    Normal TM's and external ear canals, both ears  Nose:   Deferred due to COVID  Throat:   Neck:   Supple, symmetrical, trachea midline, no adenopathy;    Thyroid: no enlargement/tenderness/nodules  Back:     Symmetric, no curvature, ROM normal, no CVA tenderness  Lungs:     Clear to auscultation bilaterally, respirations unlabored  Chest Wall:    No tenderness or deformity   Heart:    Regular rate and rhythm, S1 and S2 normal, no murmur, rub   or gallop  Breast Exam:     Deferred to GYN  Abdomen:     Soft, non-tender, bowel sounds active all four quadrants,    no masses, no organomegaly  Genitalia:    Deferred to GYN  Rectal:    Extremities:   Extremities normal, atraumatic, no cyanosis or edema  Pulses:   2+ and symmetric all extremities  Skin:   Skin color, texture, turgor normal, no rashes or lesions  Lymph nodes:   Cervical, supraclavicular, and axillary nodes normal  Neurologic:   CNII-XII intact, normal strength, sensation and reflexes    throughout          Assessment & Plan:

## 2019-06-06 NOTE — Assessment & Plan Note (Signed)
Pt's PE WNL.  UTD on GYN, colonoscopy, immunizations.  Check labs.  Anticipatory guidance provided.  

## 2019-06-07 LAB — TSH: TSH: 1.71 mIU/L (ref 0.40–4.50)

## 2019-06-07 LAB — HEPATIC FUNCTION PANEL
AG Ratio: 1.7 (calc) (ref 1.0–2.5)
ALT: 15 U/L (ref 6–29)
AST: 14 U/L (ref 10–35)
Albumin: 4.3 g/dL (ref 3.6–5.1)
Alkaline phosphatase (APISO): 84 U/L (ref 37–153)
Bilirubin, Direct: 0.1 mg/dL (ref 0.0–0.2)
Globulin: 2.6 g/dL (calc) (ref 1.9–3.7)
Indirect Bilirubin: 0.3 mg/dL (calc) (ref 0.2–1.2)
Total Bilirubin: 0.4 mg/dL (ref 0.2–1.2)
Total Protein: 6.9 g/dL (ref 6.1–8.1)

## 2019-06-07 LAB — BASIC METABOLIC PANEL
BUN: 21 mg/dL (ref 7–25)
CO2: 26 mmol/L (ref 20–32)
Calcium: 9.8 mg/dL (ref 8.6–10.4)
Chloride: 102 mmol/L (ref 98–110)
Creat: 0.69 mg/dL (ref 0.50–1.05)
Glucose, Bld: 101 mg/dL — ABNORMAL HIGH (ref 65–99)
Potassium: 4 mmol/L (ref 3.5–5.3)
Sodium: 139 mmol/L (ref 135–146)

## 2019-06-07 LAB — LIPID PANEL
Cholesterol: 183 mg/dL (ref <200)
HDL: 41 mg/dL — ABNORMAL LOW (ref >=50)
LDL Cholesterol (Calc): 119 mg/dL (calc) — ABNORMAL HIGH
Non-HDL Cholesterol (Calc): 142 mg/dL (calc) — ABNORMAL HIGH (ref <130)
Total CHOL/HDL Ratio: 4.5 (calc) (ref <5.0)
Triglycerides: 123 mg/dL (ref <150)

## 2019-06-07 LAB — CBC WITH DIFFERENTIAL/PLATELET
Absolute Monocytes: 400 cells/uL (ref 200–950)
Basophils Absolute: 30 cells/uL (ref 0–200)
Basophils Relative: 0.4 %
Eosinophils Absolute: 89 cells/uL (ref 15–500)
Eosinophils Relative: 1.2 %
HCT: 41.6 % (ref 35.0–45.0)
Hemoglobin: 13.9 g/dL (ref 11.7–15.5)
Lymphs Abs: 2464 cells/uL (ref 850–3900)
MCH: 29.7 pg (ref 27.0–33.0)
MCHC: 33.4 g/dL (ref 32.0–36.0)
MCV: 88.9 fL (ref 80.0–100.0)
MPV: 9.2 fL (ref 7.5–12.5)
Monocytes Relative: 5.4 %
Neutro Abs: 4418 cells/uL (ref 1500–7800)
Neutrophils Relative %: 59.7 %
Platelets: 398 10*3/uL (ref 140–400)
RBC: 4.68 10*6/uL (ref 3.80–5.10)
RDW: 13.2 % (ref 11.0–15.0)
Total Lymphocyte: 33.3 %
WBC: 7.4 10*3/uL (ref 3.8–10.8)

## 2019-06-09 ENCOUNTER — Encounter: Payer: Self-pay | Admitting: General Practice

## 2019-06-23 ENCOUNTER — Ambulatory Visit: Payer: 59 | Attending: Internal Medicine

## 2019-06-23 DIAGNOSIS — Z23 Encounter for immunization: Secondary | ICD-10-CM

## 2019-06-23 NOTE — Progress Notes (Signed)
   Covid-19 Vaccination Clinic  Name:  Sandra Benitez    MRN: [id number] DOB: 01-19-1961  06/23/2019  [name] was observed post Covid-19 immunization for 15 minutes without incident. She was provided with Vaccine Information Sheet and instruction to access the V-Safe system.   [name] was instructed to call 911 with any severe reactions post vaccine: Marland Kitchen Difficulty breathing  . Swelling of face and throat  . A fast heartbeat  . A bad rash all over body  . Dizziness and weakness   Immunizations Administered    Name Date Dose VIS Date Route   Pfizer COVID-19 Vaccine 06/23/2019  8:22 AM 0.3 mL 03/14/2019 Intramuscular   Manufacturer: [place]   Lot: [id number]   [place]: [id number]

## 2019-06-27 ENCOUNTER — Other Ambulatory Visit: Payer: Self-pay | Admitting: Family Medicine

## 2019-06-30 ENCOUNTER — Telehealth (INDEPENDENT_AMBULATORY_CARE_PROVIDER_SITE_OTHER): Payer: 59 | Admitting: Family Medicine

## 2019-06-30 ENCOUNTER — Encounter: Payer: Self-pay | Admitting: Family Medicine

## 2019-06-30 DIAGNOSIS — K529 Noninfective gastroenteritis and colitis, unspecified: Secondary | ICD-10-CM

## 2019-06-30 MED ORDER — ONDANSETRON 8 MG PO TBDP: 8.0000 mg | ORAL_TABLET | Freq: Three times a day (TID) | ORAL | 0 refills | Status: DC | PRN

## 2019-06-30 NOTE — Progress Notes (Signed)
   Sandra Benitez is a 59 y.o. female who presents today for a virtual office visit.  Assessment/Plan:  Nausea/vomiting/diarrhea Consistent with viral gastroenteritis.  No red flags.  Will send in Zofran.  Recommended Pepto-Bismol as well.  Encourage good oral hydration.  Discussed reasons return to care.  Follow-up as needed.    Subjective:  HPI:  Patient with nausea, vomiting, diarrhea for the past several hours.  Daughter has been sick with similar symptoms.  She is currently visiting from [place].  No obvious precipitating events.  No fevers or chills.  Some abdominal cramping.  No melena or hematochezia.  No reported hematemesis.  No specific treatments tried.       Objective/Observations  Physical Exam: Gen: NAD, resting comfortably Pulm: Normal work of breathing Neuro: Grossly normal, moves all extremities Psych: Normal affect and thought content  Virtual Visit via Video   I connected with Sandra Benitez on 06/30/19 at 11:20 AM EDT by a video enabled telemedicine application and verified that I am speaking with the correct person using two identifiers. The limitations of evaluation and management by telemedicine and the availability of in person appointments were discussed. The patient expressed understanding and agreed to proceed.   Patient location: Home Provider location: [place] participating in the virtual visit: Myself and Patient     Algis Greenhouse. Jerline Pain, MD 06/30/2019 11:10 AM

## 2019-06-30 NOTE — Telephone Encounter (Signed)
Xanax last rx 03/31/19 #180 LOV: 06/06/19 CPE

## 2019-07-16 ENCOUNTER — Ambulatory Visit: Payer: 59 | Attending: Internal Medicine

## 2019-07-16 DIAGNOSIS — Z23 Encounter for immunization: Secondary | ICD-10-CM

## 2019-07-16 NOTE — Progress Notes (Signed)
   Covid-19 Vaccination Clinic  Name:  Sandra Benitez    MRN: [id number] DOB: 05-Jan-1961  07/16/2019  [name] was observed post Covid-19 immunization for 15 minutes without incident. She was provided with Vaccine Information Sheet and instruction to access the V-Safe system.   [name] was instructed to call 911 with any severe reactions post vaccine: Marland Kitchen Difficulty breathing  . Swelling of face and throat  . A fast heartbeat  . A bad rash all over body  . Dizziness and weakness   Immunizations Administered    Name Date Dose VIS Date Route   Pfizer COVID-19 Vaccine 07/16/2019  9:49 AM 0.3 mL 03/14/2019 Intramuscular   Manufacturer: [place]   Lot: [id number]   [place]: [id number]

## 2019-08-25 ENCOUNTER — Encounter: Payer: Self-pay | Admitting: Family Medicine

## 2019-09-10 ENCOUNTER — Other Ambulatory Visit: Payer: Self-pay | Admitting: Obstetrics and Gynecology

## 2019-09-10 DIAGNOSIS — N6489 Other specified disorders of breast: Secondary | ICD-10-CM

## 2019-09-18 ENCOUNTER — Other Ambulatory Visit: Payer: 59

## 2019-09-19 ENCOUNTER — Other Ambulatory Visit: Payer: Self-pay

## 2019-09-19 ENCOUNTER — Ambulatory Visit
Admission: RE | Admit: 2019-09-19 | Discharge: 2019-09-19 | Disposition: A | Discharge status: Home / Self Care | Payer: 59 | Source: Ambulatory Visit | Attending: Obstetrics and Gynecology | Admitting: Obstetrics and Gynecology

## 2019-09-19 ENCOUNTER — Ambulatory Visit: Payer: 59

## 2019-09-19 DIAGNOSIS — N6489 Other specified disorders of breast: Secondary | ICD-10-CM

## 2019-09-23 ENCOUNTER — Encounter: Payer: Self-pay | Admitting: Family Medicine

## 2019-10-13 ENCOUNTER — Encounter: Payer: Self-pay | Admitting: Family Medicine

## 2019-10-13 ENCOUNTER — Other Ambulatory Visit: Payer: Self-pay

## 2019-10-13 ENCOUNTER — Ambulatory Visit (INDEPENDENT_AMBULATORY_CARE_PROVIDER_SITE_OTHER): Payer: 59 | Admitting: Family Medicine

## 2019-10-13 VITALS — BP 136/80 | HR 80 | Temp 97.9°F | Resp 16 | Ht 64.5 in | Wt 178.1 lb

## 2019-10-13 DIAGNOSIS — H02403 Unspecified ptosis of bilateral eyelids: Secondary | ICD-10-CM

## 2019-10-13 DIAGNOSIS — G4700 Insomnia, unspecified: Secondary | ICD-10-CM | POA: Diagnosis not present

## 2019-10-13 DIAGNOSIS — H02409 Unspecified ptosis of unspecified eyelid: Secondary | ICD-10-CM | POA: Insufficient documentation

## 2019-10-13 DIAGNOSIS — G2581 Restless legs syndrome: Secondary | ICD-10-CM | POA: Diagnosis not present

## 2019-10-13 LAB — BASIC METABOLIC PANEL
BUN: 20 mg/dL (ref 6–23)
CO2: 26 mEq/L (ref 19–32)
Calcium: 9.5 mg/dL (ref 8.4–10.5)
Chloride: 104 mEq/L (ref 96–112)
Creatinine, Ser: 0.73 mg/dL (ref 0.40–1.20)
GFR: 82.12 mL/min (ref >60.00)
Glucose, Bld: 96 mg/dL (ref 70–99)
Potassium: 4.8 mEq/L (ref 3.5–5.1)
Sodium: 138 mEq/L (ref 135–145)

## 2019-10-13 LAB — CBC WITH DIFFERENTIAL/PLATELET
Basophils Absolute: 0.1 10*3/uL (ref 0.0–0.1)
Basophils Relative: 0.9 % (ref 0.0–3.0)
Eosinophils Absolute: 0.1 10*3/uL (ref 0.0–0.7)
Eosinophils Relative: 1.8 % (ref 0.0–5.0)
HCT: 39.9 % (ref 36.0–46.0)
Hemoglobin: 13.2 g/dL (ref 12.0–15.0)
Lymphocytes Relative: 40.3 % (ref 12.0–46.0)
Lymphs Abs: 2.4 10*3/uL (ref 0.7–4.0)
MCHC: 33 g/dL (ref 30.0–36.0)
MCV: 90.5 fl (ref 78.0–100.0)
Monocytes Absolute: 0.4 10*3/uL (ref 0.1–1.0)
Monocytes Relative: 5.9 % (ref 3.0–12.0)
Neutro Abs: 3.1 10*3/uL (ref 1.4–7.7)
Neutrophils Relative %: 51.1 % (ref 43.0–77.0)
Platelets: 384 10*3/uL (ref 150.0–400.0)
RBC: 4.4 Mil/uL (ref 3.87–5.11)
RDW: 13.6 % (ref 11.5–15.5)
WBC: 6 10*3/uL (ref 4.0–10.5)

## 2019-10-13 LAB — MAGNESIUM: Magnesium: 2.1 mg/dL (ref 1.5–2.5)

## 2019-10-13 LAB — TSH: TSH: 2.73 u[IU]/mL (ref 0.35–4.50)

## 2019-10-13 LAB — B12 AND FOLATE PANEL
Folate: 20.2 ng/mL (ref >5.9)
Vitamin B-12: 286 pg/mL (ref 211–911)

## 2019-10-13 MED ORDER — ROPINIROLE HCL 0.5 MG PO TABS: ORAL_TABLET | ORAL | 3 refills | Status: DC

## 2019-10-13 NOTE — Patient Instructions (Signed)
Follow up in 6 weeks to recheck insomnia We'll notify you of your lab results and make any changes if needed We'll call you with your Oculoplastics appt START the Ropinirole 1/2 tab nightly x4 days and then increase to 1 tab nightly Continue to work on healthy diet and regular exercise- you can do it! I'm proud of you for starting counseling! Call with any questions or concerns Hang in there!

## 2019-10-13 NOTE — Progress Notes (Signed)
   Subjective:    Patient ID: Sandra Benitez, female    DOB: 1960/09/11, 59 y.o.   MRN: [id number]  HPI RLS- chronic problem, on Sinemet 25-100mg  2 tabs nightly, Zinc, iron, and magnesium supplements.  Has been on the Sinemet 'forever' and wonders if medication needs to be changed.  Insomnia- pt reports she is able to fall asleep but is unable to stay asleep.  Reports increased responsibility at work.  Doesn't wake up thinking about work and thinks she is managing well but is aware this could be the cause.  Once she is awake she is aware of LBP (she is seeing chiropractor) or her RLS keeps her up.  Continues to use CPAP nightly.  Having difficulty w/ concentration due to being tired.  Starts counseling sessions tomorrow.  Drooping lid- pt's upper lids are overhanging to point that no crease is visible.  Not yet interfering w/ vision.   Review of Systems For ROS see HPI   This visit occurred during the SARS-CoV-2 public health emergency.  Safety protocols were in place, including screening questions prior to the visit, additional usage of staff PPE, and extensive cleaning of exam room while observing appropriate contact time as indicated for disinfecting solutions.       Objective:   Physical Exam Vitals reviewed.  Constitutional:      General: She is not in acute distress.    Appearance: Normal appearance. She is not ill-appearing.  HENT:     Head: Normocephalic and atraumatic.  Eyes:     Extraocular Movements: Extraocular movements intact.     Conjunctiva/sclera: Conjunctivae normal.     Pupils: Pupils are equal, round, and reactive to light.     Comments: Bilateral overhanging upper lids  Skin:    General: Skin is warm and dry.  Neurological:     General: No focal deficit present.     Mental Status: She is alert and oriented to person, place, and time.  Psychiatric:        Mood and Affect: Mood normal.        Behavior: Behavior normal.        Thought Content: Thought content  normal.           Assessment & Plan:

## 2019-10-13 NOTE — Assessment & Plan Note (Signed)
Worsening and becoming more bothersome to patient.  Will refer to [facility] for evaluation and treatment.  Pt expressed understanding and is in agreement w/ plan.

## 2019-10-13 NOTE — Assessment & Plan Note (Signed)
New.  Likely multifactorial- stress, RLS, back pain.  She starts counseling tomorrow, is seeing a chiropractor, and we will adjust RLS medication.  Will follow closely.  Pt expressed understanding and is in agreement w/ plan.

## 2019-10-13 NOTE — Assessment & Plan Note (Signed)
Deteriorated.  Once pt is up, she is not able to fall back to sleep due to sxs.  Check labs to assess metabolic cause.  Will not adjust Sinemet but will add low dose Requip and monitor for improvement.

## 2019-10-14 ENCOUNTER — Encounter: Payer: Self-pay | Admitting: General Practice

## 2019-10-14 LAB — IRON,TIBC AND FERRITIN PANEL
%SAT: 27 % (calc) (ref 16–45)
Ferritin: 42 ng/mL (ref 16–232)
Iron: 100 ug/dL (ref 45–160)
TIBC: 372 mcg/dL (calc) (ref 250–450)

## 2019-11-24 ENCOUNTER — Telehealth (INDEPENDENT_AMBULATORY_CARE_PROVIDER_SITE_OTHER): Payer: 59 | Admitting: Family Medicine

## 2019-11-24 ENCOUNTER — Other Ambulatory Visit: Payer: Self-pay

## 2019-11-24 ENCOUNTER — Encounter: Payer: Self-pay | Admitting: Family Medicine

## 2019-11-24 ENCOUNTER — Telehealth: Payer: 59 | Admitting: Family Medicine

## 2019-11-24 DIAGNOSIS — G4700 Insomnia, unspecified: Secondary | ICD-10-CM

## 2019-11-24 DIAGNOSIS — G2581 Restless legs syndrome: Secondary | ICD-10-CM | POA: Diagnosis not present

## 2019-11-24 NOTE — Progress Notes (Signed)
I have discussed the procedure for the virtual visit with the patient who has given consent to proceed with assessment and treatment.   Pt unable to obtain vitals.   Lynda Capistran L Rosan Calbert, CMA     

## 2019-11-24 NOTE — Progress Notes (Signed)
Virtual Visit via Video   I connected with patient on 11/24/19 at  2:00 PM EDT by a video enabled telemedicine application and verified that I am speaking with the correct person using two identifiers.  Location patient: Home Location provider: Acupuncturist, Office Persons participating in the virtual visit: Patient, Provider, [place] (Jess B)  I discussed the limitations of evaluation and management by telemedicine and the availability of in person appointments. The patient expressed understanding and agreed to proceed.  Subjective:   HPI:   RLS/Insomnia- at last visit we added Requip to the Sinemet she is already taking.  Reports RLS is calming down.  Sleeping better.  She has since started counseling which is improving sleep and mood.   ROS:   See pertinent positives and negatives per HPI.  Patient Active Problem List   Diagnosis Date Noted  . Insomnia 10/13/2019  . Drooping eyelid 10/13/2019  . Physical exam 06/01/2016  . Palpitations 11/29/2015  . Anxiety and depression 11/29/2015  . Restless leg syndrome 09/28/2015  . Obstructive sleep apnea 09/22/2015    Social History   Tobacco Use  . Smoking status: Never Smoker  . Smokeless tobacco: Never Used  Substance Use Topics  . Alcohol use: Yes    Comment: Occasional    Current Outpatient Medications:  .  ALPRAZolam (XANAX) 0.25 MG tablet, TAKE 1 TABLET BY MOUTH 3  TIMES DAILY AS NEEDED FOR  ANXIETY, Disp: 90 tablet, Rfl: 0 .  carbidopa-levodopa (SINEMET IR) 25-100 MG tablet, TAKE 2 TABLETS BY MOUTH AT  BEDTIME, Disp: 180 tablet, Rfl: 3 .  chlorpheniramine (CHLOR-TRIMETON) 4 MG tablet, Take 8 mg by mouth at bedtime., Disp: , Rfl:  .  Cholecalciferol (VITAMIN D3) 125 MCG (5000 UT) TABS, Take 5,000 Units by mouth daily., Disp: , Rfl:  .  diclofenac Sodium (VOLTAREN) 1 % GEL, Apply 1 application topically 2 (two) times daily. Applied to right hand, Disp: , Rfl:  .  escitalopram (LEXAPRO) 5 MG tablet, Take 5 mg by  mouth daily., Disp: , Rfl:  .  estradiol (VIVELLE-DOT) 0.05 MG/24HR patch, Place 1 patch onto the skin 2 (two) times a week. Mondays & Thursdays., Disp: , Rfl: 3 .  ferrous sulfate 325 (65 FE) MG tablet, Take 325 mg by mouth daily with breakfast., Disp: , Rfl:  .  fluticasone (FLONASE) 50 MCG/ACT nasal spray, USE 2 SPRAYS IN EACH  NOSTRIL DAILY, Disp: 16 g, Rfl: 2 .  levonorgestrel (MIRENA) 20 MCG/24HR IUD, 1 each by Intrauterine route once., Disp: , Rfl:  .  MAGNESIUM PO, Take by mouth., Disp: , Rfl:  .  metoprolol tartrate (LOPRESSOR) 25 MG tablet, Take 12.5 mg by mouth daily. , Disp: , Rfl:  .  progesterone (PROMETRIUM) 100 MG capsule, Take 100 mg by mouth at bedtime., Disp: , Rfl:  .  rOPINIRole (REQUIP) 0.5 MG tablet, Take 1/2 tab nightly x4 days and then increase to 1 tab nightly for RLS, Disp: 30 tablet, Rfl: 3 .  Zinc 50 MG CAPS, Take by mouth., Disp: , Rfl:   Allergies  Allergen Reactions  . Penicillins     Has patient had a PCN reaction causing immediate rash, facial/tongue/throat swelling, SOB or lightheadedness with hypotension: YES Has patient had a PCN reaction causing severe rash involving mucus membranes or skin necrosis: NO Has patient had a PCN reaction that required hospitalization NO Has patient had a PCN reaction occurring within the last 10 years: NO If all of the above answers are "NO",  then may proceed with Cephalosporin use.  . Other     Fluoroquinolone--- Rash   . Sulfa Antibiotics Hives  . Bactrim [Sulfamethoxazole-Trimethoprim] Rash    Objective:   There were no vitals taken for this visit.  AAOx3, NAD NCAT, EOMI No obvious CN deficits Coloring WNL Pt is able to speak clearly, coherently without shortness of breath or increased work of breathing.  Thought process is linear.  Mood is appropriate.   Assessment and Plan:   RLS/Insomnia- both improved since starting Requip.  Pt looks and feels much better- appears lighter and happier.  No med changes at  this time.  Will follow.   Annye Asa, MD 11/24/2019

## 2019-12-01 ENCOUNTER — Telehealth: Payer: Self-pay | Admitting: Family Medicine

## 2019-12-01 NOTE — Telephone Encounter (Signed)
[facility]astic surgeons office called and they need office notes from Dr. Birdie Riddle on her upcoming appoinment   Dr. Rickard Rhymes - [phone]

## 2019-12-02 DIAGNOSIS — F419 Anxiety disorder, unspecified: Secondary | ICD-10-CM | POA: Insufficient documentation

## 2019-12-02 DIAGNOSIS — R000 Tachycardia, unspecified: Secondary | ICD-10-CM | POA: Insufficient documentation

## 2019-12-02 NOTE — Telephone Encounter (Signed)
Office notes faxed to [phone]

## 2019-12-02 NOTE — Telephone Encounter (Signed)
Ok to send notes.

## 2019-12-02 NOTE — Telephone Encounter (Signed)
OV notes printed and placed in bin to be faxed.

## 2019-12-02 NOTE — Telephone Encounter (Signed)
Ok to send whatever pt needs to proceed

## 2019-12-02 NOTE — Telephone Encounter (Signed)
Office notes were faxed when the referral was sent.

## 2020-01-12 ENCOUNTER — Other Ambulatory Visit: Payer: Self-pay | Admitting: Family Medicine

## 2020-02-02 ENCOUNTER — Other Ambulatory Visit: Payer: Self-pay | Admitting: General Practice

## 2020-02-02 NOTE — Telephone Encounter (Signed)
Last OV 11/24/19 requip last filled 01/12/20 #90 with 1  Pt requesting to mail order.

## 2020-02-03 MED ORDER — ROPINIROLE HCL 0.5 MG PO TABS
0.5000 mg | ORAL_TABLET | Freq: Every day | ORAL | 1 refills | Status: DC
Start: 2020-02-03 — End: 2020-04-26

## 2020-02-23 DIAGNOSIS — M542 Cervicalgia: Secondary | ICD-10-CM | POA: Insufficient documentation

## 2020-03-11 ENCOUNTER — Ambulatory Visit: Payer: 59 | Admitting: Internal Medicine

## 2020-03-23 ENCOUNTER — Other Ambulatory Visit: Payer: Self-pay | Admitting: Physician Assistant

## 2020-03-23 ENCOUNTER — Other Ambulatory Visit: Payer: Self-pay | Admitting: Family Medicine

## 2020-03-23 NOTE — Telephone Encounter (Signed)
LFD 06/30/19 #90 with no refills LOV 11/24/19 NOV none

## 2020-03-23 NOTE — Telephone Encounter (Signed)
Meloxicam not on patient's current medication list. Please advise

## 2020-04-14 ENCOUNTER — Encounter: Payer: Self-pay | Admitting: Family Medicine

## 2020-04-16 ENCOUNTER — Other Ambulatory Visit: Payer: Self-pay | Admitting: Emergency Medicine

## 2020-04-16 DIAGNOSIS — G2581 Restless legs syndrome: Secondary | ICD-10-CM

## 2020-04-22 ENCOUNTER — Other Ambulatory Visit: Payer: Self-pay | Admitting: Family Medicine

## 2020-04-24 ENCOUNTER — Other Ambulatory Visit: Payer: Self-pay | Admitting: Family Medicine

## 2020-05-11 ENCOUNTER — Other Ambulatory Visit: Payer: Self-pay | Admitting: Gastroenterology

## 2020-05-20 NOTE — Progress Notes (Signed)
Attempted to obtain medical history via telephone, unable to reach at this time. I left a voicemail to return pre surgical testing department's phone call.  

## 2020-05-24 ENCOUNTER — Other Ambulatory Visit: Payer: Self-pay

## 2020-05-24 ENCOUNTER — Encounter (HOSPITAL_COMMUNITY): Payer: Self-pay | Admitting: Gastroenterology

## 2020-05-25 ENCOUNTER — Other Ambulatory Visit (HOSPITAL_COMMUNITY)
Admission: RE | Admit: 2020-05-25 | Discharge: 2020-05-25 | Disposition: A | Discharge status: Home / Self Care | Payer: 59 | Source: Ambulatory Visit | Attending: Gastroenterology | Admitting: Gastroenterology

## 2020-05-25 DIAGNOSIS — Z20822 Contact with and (suspected) exposure to covid-19: Secondary | ICD-10-CM | POA: Diagnosis not present

## 2020-05-25 DIAGNOSIS — Z01812 Encounter for preprocedural laboratory examination: Secondary | ICD-10-CM | POA: Diagnosis present

## 2020-05-25 LAB — SARS CORONAVIRUS 2 (TAT 6-24 HRS): SARS Coronavirus 2: NEGATIVE

## 2020-05-28 ENCOUNTER — Other Ambulatory Visit: Payer: Self-pay

## 2020-05-28 ENCOUNTER — Ambulatory Visit (HOSPITAL_COMMUNITY): Payer: 59 | Admitting: Anesthesiology

## 2020-05-28 ENCOUNTER — Ambulatory Visit (HOSPITAL_COMMUNITY)
Admission: RE | Admit: 2020-05-28 | Discharge: 2020-05-28 | Disposition: A | Discharge status: Home / Self Care | Payer: 59 | Source: Ambulatory Visit | Attending: Gastroenterology | Admitting: Gastroenterology

## 2020-05-28 ENCOUNTER — Encounter (HOSPITAL_COMMUNITY): Payer: Self-pay | Admitting: Gastroenterology

## 2020-05-28 ENCOUNTER — Encounter (HOSPITAL_COMMUNITY)
Admission: RE | Disposition: A | Discharge status: Home / Self Care | Payer: Self-pay | Source: Ambulatory Visit | Attending: Gastroenterology

## 2020-05-28 DIAGNOSIS — Z1211 Encounter for screening for malignant neoplasm of colon: Secondary | ICD-10-CM | POA: Diagnosis present

## 2020-05-28 DIAGNOSIS — Z882 Allergy status to sulfonamides status: Secondary | ICD-10-CM | POA: Diagnosis not present

## 2020-05-28 DIAGNOSIS — Z86010 Personal history of colonic polyps: Secondary | ICD-10-CM | POA: Diagnosis not present

## 2020-05-28 DIAGNOSIS — D128 Benign neoplasm of rectum: Secondary | ICD-10-CM | POA: Diagnosis not present

## 2020-05-28 DIAGNOSIS — Z880 Allergy status to penicillin: Secondary | ICD-10-CM | POA: Insufficient documentation

## 2020-05-28 DIAGNOSIS — Z881 Allergy status to other antibiotic agents status: Secondary | ICD-10-CM | POA: Diagnosis not present

## 2020-05-28 DIAGNOSIS — Z8379 Family history of other diseases of the digestive system: Secondary | ICD-10-CM | POA: Insufficient documentation

## 2020-05-28 DIAGNOSIS — Z800 Family history of malignant neoplasm of digestive organs: Secondary | ICD-10-CM | POA: Insufficient documentation

## 2020-05-28 DIAGNOSIS — K589 Irritable bowel syndrome without diarrhea: Secondary | ICD-10-CM | POA: Diagnosis not present

## 2020-05-28 HISTORY — PX: FLEXIBLE SIGMOIDOSCOPY: SHX5431

## 2020-05-28 HISTORY — PX: POLYPECTOMY: SHX5525

## 2020-05-28 HISTORY — PX: BIOPSY: SHX5522

## 2020-05-28 SURGERY — SIGMOIDOSCOPY, FLEXIBLE
Anesthesia: Monitor Anesthesia Care

## 2020-05-28 MED ORDER — PHENYLEPHRINE 40 MCG/ML (10ML) SYRINGE FOR IV PUSH (FOR BLOOD PRESSURE SUPPORT)
PREFILLED_SYRINGE | INTRAVENOUS | Status: DC | PRN
  Administered 2020-05-28: 40 ug via INTRAVENOUS
  Administered 2020-05-28: 80 ug via INTRAVENOUS

## 2020-05-28 MED ORDER — PHENYLEPHRINE HCL-NACL 10-0.9 MG/250ML-% IV SOLN
INTRAVENOUS | Status: DC | PRN
  Administered 2020-05-28: 150 ug/min via INTRAVENOUS

## 2020-05-28 MED ORDER — SODIUM CHLORIDE 0.9 % IV SOLN: INTRAVENOUS | Status: DC

## 2020-05-28 MED ORDER — LACTATED RINGERS IV SOLN
INTRAVENOUS | Status: AC | PRN
  Administered 2020-05-28: 10 mL/h via INTRAVENOUS

## 2020-05-28 MED ORDER — LIDOCAINE HCL (CARDIAC) PF 100 MG/5ML IV SOSY
PREFILLED_SYRINGE | INTRAVENOUS | Status: DC | PRN
  Administered 2020-05-28: 20 mg via INTRAVENOUS
  Administered 2020-05-28: 40 mg via INTRAVENOUS

## 2020-05-28 MED ORDER — PROPOFOL 10 MG/ML IV BOLUS
INTRAVENOUS | Status: DC | PRN
  Administered 2020-05-28: 30 mg via INTRAVENOUS

## 2020-05-28 NOTE — Anesthesia Postprocedure Evaluation (Signed)
Anesthesia Post Note  Patient: Sandra Benitez  Procedure(s) Performed: FLEXIBLE SIGMOIDOSCOPY (N/A ) POLYPECTOMY BIOPSY     Patient location during evaluation: PACU Anesthesia Type: MAC Level of consciousness: awake and alert Pain management: pain level controlled Vital Signs Assessment: post-procedure vital signs reviewed and stable Respiratory status: spontaneous breathing, nonlabored ventilation, respiratory function stable and patient connected to nasal cannula oxygen Cardiovascular status: stable and blood pressure returned to baseline Postop Assessment: no apparent nausea or vomiting Anesthetic complications: no   No complications documented.  Last Vitals:  Vitals:   05/28/20 1030 05/28/20 1040  BP: (!) 88/61 105/64  Pulse: 63 63  Resp: 17 16  Temp:    SpO2: 96% 99%    Last Pain:  Vitals:   05/28/20 1040  TempSrc:   PainSc: 0-No pain                 ROSE,GEORGE S

## 2020-05-28 NOTE — Op Note (Signed)
[facility] Patient Name: Sandra Benitez Procedure Date: 05/28/2020 MRN: [id number] Attending MD: Carol Ada , MD Date of Birth: May 18, 1960 CSN: [id number] Age: 60 Admit Type: Outpatient Procedure:                Flexible Sigmoidoscopy Indications:              High risk colon cancer surveillance: Personal                            history of colonic polyps Providers:                Carol Ada, MD, Cleda Daub, RN, Benetta Spar, Technician Referring MD:              Medicines:                Propofol per Anesthesia Complications:            No immediate complications. Estimated Blood Loss:     Estimated blood loss was minimal. Procedure:                Pre-Anesthesia Assessment:                           - Prior to the procedure, a History and Physical                            was performed, and patient medications and                            allergies were reviewed. The patient's tolerance of                            previous anesthesia was also reviewed. The risks                            and benefits of the procedure and the sedation                            options and risks were discussed with the patient.                            All questions were answered, and informed consent                            was obtained. Prior Anticoagulants: The patient has                            taken no previous anticoagulant or antiplatelet                            agents. ASA Grade Assessment: II - A patient with  mild systemic disease. After reviewing the risks                            and benefits, the patient was deemed in                            satisfactory condition to undergo the procedure.                           - Sedation was administered by an anesthesia                            professional. Deep sedation was attained.                           After obtaining informed consent,  the scope was                            passed under direct vision. The GIF-H190 ([id number])                            Olympus gastroscope was introduced through the anus                            and advanced to the the descending colon. The                            flexible sigmoidoscopy was technically difficult                            and complex. The quality of the bowel preparation                            was good. Scope In: Scope Out: Findings:      A 8 mm polyp was found in the rectum. The polyp was sessile. The polyp       was removed with a cold snare. The polyp was removed with a piecemeal       technique using a cold snare. Resection and retrieval were complete. Impression:               - One 8 mm polyp in the rectum, removed with a cold                            snare and removed piecemeal using a cold snare.                            Resected and retrieved. Moderate Sedation:      Not Applicable - Patient had care per Anesthesia. Recommendation:           - Patient has a contact number available for                            emergencies. The signs and symptoms of potential  delayed complications were discussed with the                            patient. Return to normal activities tomorrow.                            Written discharge instructions were provided to the                            patient.                           - Resume regular diet.                           - Repeat flexible sigmoidoscopy in 6 months for                            surveillance. Procedure Code(s):        --- Professional ---                           [phone], Sigmoidoscopy, flexible; with removal of                            tumor(s), polyp(s), or other lesion(s) by snare                            technique Diagnosis Code(s):        --- Professional ---                           K62.1, Rectal polyp                           Z86.010, Personal  history of colonic polyps CPT copyright 2019 [employer]. All rights reserved. The codes documented in this report are preliminary and upon coder review may  be revised to meet current compliance requirements. Carol Ada, MD Carol Ada, MD 05/28/2020 10:29:34 AM This report has been signed electronically. Number of Addenda: 0

## 2020-05-28 NOTE — Transfer of Care (Signed)
Immediate Anesthesia Transfer of Care Note  Patient: Sandra Benitez  Procedure(s) Performed: FLEXIBLE SIGMOIDOSCOPY (N/A ) POLYPECTOMY BIOPSY  Patient Location: PACU  Anesthesia Type:MAC  Level of Consciousness: awake and patient cooperative  Airway & Oxygen Therapy: Patient Spontanous Breathing and Patient connected to nasal cannula oxygen  Post-op Assessment: Report given to RN and Post -op Vital signs reviewed and stable  Post vital signs: Reviewed and stable  Last Vitals:  Vitals Value Taken Time  BP 83/48 05/28/20 1025  Temp    Pulse 71 05/28/20 1027  Resp 12 05/28/20 1027  SpO2 99 % 05/28/20 1027  Vitals shown include unvalidated device data.  Last Pain:  Vitals:   05/28/20 0913  TempSrc: Axillary  PainSc: 0-No pain         Complications: No complications documented.

## 2020-05-28 NOTE — Discharge Instructions (Signed)

## 2020-05-28 NOTE — Anesthesia Preprocedure Evaluation (Signed)
Anesthesia Evaluation  Patient identified by MRN, date of birth, ID band Patient awake    Reviewed: Allergy & Precautions, H&P , NPO status , Patient's Chart, lab work & pertinent test results  Airway Mallampati: II  TM Distance: >3 FB Neck ROM: Full    Dental no notable dental hx.    Pulmonary sleep apnea ,    Pulmonary exam normal breath sounds clear to auscultation       Cardiovascular negative cardio ROS Normal cardiovascular exam Rhythm:Regular Rate:Normal     Neuro/Psych negative neurological ROS  negative psych ROS   GI/Hepatic negative GI ROS, Neg liver ROS,   Endo/Other  negative endocrine ROS  Renal/GU negative Renal ROS  negative genitourinary   Musculoskeletal negative musculoskeletal ROS (+)   Abdominal   Peds negative pediatric ROS (+)  Hematology negative hematology ROS (+)   Anesthesia Other Findings   Reproductive/Obstetrics negative OB ROS                             Anesthesia Physical Anesthesia Plan  ASA: II  Anesthesia Plan: MAC   Post-op Pain Management:    Induction: Intravenous  PONV Risk Score and Plan: 0  Airway Management Planned: Simple Face Mask  Additional Equipment:   Intra-op Plan:   Post-operative Plan:   Informed Consent: I have reviewed the patients History and Physical, chart, labs and discussed the procedure including the risks, benefits and alternatives for the proposed anesthesia with the patient or authorized representative who has indicated his/her understanding and acceptance.     Dental advisory given  Plan Discussed with: CRNA and Surgeon  Anesthesia Plan Comments:         Anesthesia Quick Evaluation

## 2020-05-28 NOTE — H&P (Signed)
  Sandra Benitez HPI: During a routine screening colonoscopy last year she was identified to have a TSA at the anal verge.  She is here for follow up of that lesion.  Past Medical History:  Diagnosis Date  . Anemia   . IBS (irritable bowel syndrome)   . PVC (premature ventricular contraction)   . Sleep apnea   . Yeast infection     Past Surgical History:  Procedure Laterality Date  . BIOPSY  04/10/2019   Procedure: BIOPSY;  Surgeon: Sandra Craver, MD;  Location: [facility];  Service: Endoscopy;;  . COLONOSCOPY WITH PROPOFOL N/A 04/10/2019   Procedure: COLONOSCOPY WITH PROPOFOL;  Surgeon: Sandra Craver, MD;  Location: [facility];  Service: Endoscopy;  Laterality: N/A;  . FACIAL COSMETIC SURGERY    . LASIK    . POLYPECTOMY  04/10/2019   Procedure: POLYPECTOMY;  Surgeon: Sandra Craver, MD;  Location: [facility];  Service: Endoscopy;;  . Sandra Benitez  04/10/2019   Procedure: Sandra Benitez;  Surgeon: Sandra Craver, MD;  Location: [facility];  Service: Endoscopy;;    Family History  Problem Relation Age of Onset  . Colon cancer Maternal Grandfather   . Heart disease Father   . Stroke Father   . Hypertension Father   . Hyperlipidemia Father   . Heart attack Father   . Neuropathy Father   . Heart disease Brother   . Stroke Brother   . Hypertension Brother   . Diverticulitis Mother   . Alcohol abuse Brother     Social History:  reports that she has never smoked. She has never used smokeless tobacco. She reports current alcohol use. She reports that she does not use drugs.  Allergies:  Allergies  Allergen Reactions  . Penicillins     Has patient had a PCN reaction causing immediate rash, facial/tongue/throat swelling, SOB or lightheadedness with hypotension: YES Has patient had a PCN reaction causing severe rash involving mucus membranes or skin necrosis: NO Has patient had a PCN reaction that required hospitalization NO Has patient had a PCN reaction occurring within the last 10  years: NO If all of the above answers are "NO", then may proceed with Cephalosporin use.  . Other     Fluoroquinolone--- Rash   . Sulfa Antibiotics Hives  . Bactrim [Sulfamethoxazole-Trimethoprim] Rash    Medications:  Scheduled:  Continuous: . sodium chloride    . lactated ringers 10 mL/hr (05/28/20 0915)    No results found for this or any previous visit (from the past 24 hour(s)).   No results found.  ROS:  As stated above in the HPI otherwise negative.  Blood pressure 134/83, pulse 76, temperature 97.6 F (36.4 C), temperature source Axillary, resp. rate 10, height 5\' 4"  (1.626 m), weight 88.4 kg, SpO2 98 %.    PE: Gen: NAD, Alert and Oriented HEENT:  [place]/AT, EOMI Neck: Supple, no LAD Lungs: CTA Bilaterally CV: RRR without M/G/R ABD: Soft, NTND, +BS Ext: No C/C/E  Assessment/Plan: 1) Personal history of polyp.  Sandra Benitez 05/28/2020, 9:42 AM

## 2020-05-31 ENCOUNTER — Encounter (HOSPITAL_COMMUNITY): Payer: Self-pay | Admitting: Gastroenterology

## 2020-06-01 LAB — SURGICAL PATHOLOGY

## 2020-06-12 NOTE — Progress Notes (Signed)
HPI female never smoker followed for OSA,  restless leg syndrome, complicated by anemia, anxiety, IBS Unattended Home Sleep Test-11/10/2015-severe obstructive sleep apnea-AHI 46.6/hour, desaturation to 81%, body weight 168 pounds -----------------------------------------------------------------------------------------------------------------    03/10/2019- Virtual Visit via Telephone Note  History of Present Illness: 60 year old female never smoker followed for OSA,  restless leg syndrome CPAP auto 5-20/[facility] >> today 5-15 Sinemet for Restless Legs 25-100 by PCP Presure occasionally too high. Restless legs controlled. Mentions persistent cough, typical for her last ing months after viral infections. Discussed potential trial of inhaler.    Observations/Objective: Download compliance 100%, AHI 1.6/ hr  Assessment and Plan: OSA- reduce to 5-15 Cough- post viral bronchitis. If doesn't clear, consider CXR, inhaler, maybe steroid.   Follow Up Instructions: 1 year   06/14/20- 86--year-old female never smoker followed for OSA,  restless leg syndrome, Insomnia,complicated by IBS, Anxiety/ Depression,  CPAP auto 5-15/ [facility] Sinemet for Restless Legs 25-100 and Requip by PCP Download- compliance 63%, AHI 1.4/ hr Body weight today-192 lbs Covid vax-3 Phizer Flu vax-had -----Patient is sleeping good ,machine is working good. No concerns Reviewed dowwnload. Needs to emphasize compliance.  Restless legs- treated for anemia, saw Neurologist and going for f=revisit- numbness in hip.  ROS-see HPI    + = positive Constitutional:    weight loss, night sweats, fevers, chills, fatigue, lassitude. HEENT:    headaches, difficulty swallowing, tooth/dental problems, sore throat,       sneezing, itching, ear ache, nasal congestion, post nasal drip, snoring CV:    chest pain, orthopnea, PND, swelling in lower extremities, anasarca,                                                     dizziness,  palpitations Resp:   shortness of breath with exertion or at rest.                productive cough,   non-productive cough, coughing up of blood.              change in color of mucus.  wheezing.   Skin:    rash or lesions. GI:  No-   heartburn, indigestion, abdominal pain, nausea, vomiting, diarrhea,                 change in bowel habits, loss of appetite GU: dysuria, change in color of urine, no urgency or frequency.   flank pain. MS:   joint pain, stiffness, decreased range of motion, back pain. Neuro-   +HPI Psych:  change in mood or affect.  depression or anxiety.   memory loss.  OBJ- Physical Exam General- Alert, Oriented, Affect-appropriate, Distress- none acute, + overweight Skin- rash-none, lesions- none, excoriation- none Lymphadenopathy- none Head- atraumatic            Eyes- Gross vision intact, PERRLA, conjunctivae and secretions clear            Ears- Hearing, canals-normal            Nose- Clear, no-Septal dev, mucus, polyps, erosion, perforation             Throat- Mallampati III-IV , mucosa clear , drainage- none, tonsils- atrophic Neck- flexible , trachea midline, no stridor , thyroid nl, carotid no bruit Chest - symmetrical excursion , unlabored  Heart/CV- RRR , no murmur , no gallop  , no rub, nl s1 s2                           - JVD- none , edema- none, stasis changes- none, varices- none           Lung- clear to P&A, wheeze- none, cough- none , dullness-none, rub- none           Chest wall-  Abd-  Br/ Gen/ Rectal- Not done, not indicated Extrem- cyanosis- none, clubbing, none, atrophy- none, strength- nl Neuro-not restlesslegs are not moving while she is here.

## 2020-06-14 ENCOUNTER — Encounter: Payer: Self-pay | Admitting: Internal Medicine

## 2020-06-14 ENCOUNTER — Ambulatory Visit (INDEPENDENT_AMBULATORY_CARE_PROVIDER_SITE_OTHER): Payer: 59 | Admitting: Internal Medicine

## 2020-06-14 ENCOUNTER — Other Ambulatory Visit: Payer: Self-pay

## 2020-06-14 DIAGNOSIS — G2581 Restless legs syndrome: Secondary | ICD-10-CM | POA: Diagnosis not present

## 2020-06-14 DIAGNOSIS — G4733 Obstructive sleep apnea (adult) (pediatric): Secondary | ICD-10-CM

## 2020-06-14 NOTE — Patient Instructions (Signed)
We can continue CPAP auto 5-15 ° °Please call if we can help °

## 2020-06-24 ENCOUNTER — Ambulatory Visit (INDEPENDENT_AMBULATORY_CARE_PROVIDER_SITE_OTHER): Payer: 59 | Admitting: Neurology

## 2020-06-24 ENCOUNTER — Encounter: Payer: Self-pay | Admitting: Neurology

## 2020-06-24 VITALS — BP 118/78 | HR 66 | Ht 64.0 in | Wt 190.0 lb

## 2020-06-24 DIAGNOSIS — G2581 Restless legs syndrome: Secondary | ICD-10-CM

## 2020-06-24 DIAGNOSIS — G5710 Meralgia paresthetica, unspecified lower limb: Secondary | ICD-10-CM | POA: Diagnosis not present

## 2020-06-24 MED ORDER — LIDOCAINE-PRILOCAINE 2.5-2.5 % EX CREA: TOPICAL_CREAM | CUTANEOUS | 6 refills | Status: DC

## 2020-06-24 MED ORDER — GABAPENTIN 300 MG PO CAPS: 600.0000 mg | ORAL_CAPSULE | Freq: Two times a day (BID) | ORAL | 11 refills | Status: DC

## 2020-06-24 NOTE — Progress Notes (Signed)
Chief Complaint  Patient presents with  . New Patient (Initial Visit)    Rm 10 here for consult on RLS. Has seen Dr. Carles Collet in the past for the same issue.  Dr. Birdie Riddle referred over   . RLS    Reports in dec of 2021 bilateral outer thigh numbness. Also states burning/ itchiness primarily located on the right outer thigh. Reports rarely sx are in the left leg. She is on Requip 0.5 mg at bedtime and Gabapentin 300 mg 2 times per day.  . Tremors    Reports hx of tremor hx several years ago. She is on Carb/Levo 25-100 mg 2 tabs at bedtime        ASSESSMENT AND PLAN  Sandra Benitez is a 60 y.o. female   Restless leg syndrome Meralgia paresthetica  She has been treated with Sinemet 25/100 mg titrating to 2 tablets every night, plus Requip 0.5 mg every night, recently gabapentin 300 mg 3 times daily, occasionally daytime restless leg symptoms, worry about augmentation with prolonged use of dopamine agonist/dopamine like agent  Will increase the dose of gabapentin limited to 300 mg 4 tablets every night, slowly tapering down even after dose of Sinemet, keep Requip 0.5 mg every day for right now  EMLA gel, may combine with diclofenac gel as needed for meralgia paresthetica  Multivitamin supplement, goal ferritin more than 50, currently 42 in July 2021  DIAGNOSTIC DATA (LABS, IMAGING, TESTING) - I reviewed patient records, labs, notes, testing and imaging myself where available.  Ferritin 42 in July 2021 normal CBC, BMP, TSH  HISTORICAL  Sandra Benitez is a 60 year old female, seen in request by her primary care doctor Midge Minium, for evaluation of restless leg, bilateral hands tremor.  Initial evaluation was on June 24, 2020   I reviewed and summarized the referring note.PMHX Anxiety Lexapro 5mg  qhs.  She reported long history of restless leg symptoms, started since her 47s, or even younger, she described urge to move her leg, initially only when she was attempting to go to sleep, over the  years, she has been treated with Sinemet, initially 25/100 mg 1 tablet a day, because of increased restless leg symptoms urge to move her legs before she goes to sleep, dosage was increased to 2 tablets since 2019,  Despite that, she continues to have slow worsening restless leg symptoms, she also noticed that occasionally during the day when she is very relaxed, such as where she is receiving pedicure, massage, recently she was started on Requip 0.5 mg every night to help her restless leg symptoms  She often takes her Requip 0.5 mg and Sinemet 25/100 mg 2 tablets every night around 8 PM 2 hours prior to her of sleep time, she usually do not have significant trouble going to sleep, even if she woke up in the middle of the night, she can usually go back to sleep fairly quickly   She began to experience bilateral lateral thigh area paresthesia since December 2021, sometimes burning discomfort, was seen by orthopedic surgeon, reported no significant abnormality on MRI of lumbar spine  She also experienced neck pain, was treated with epidural injection, and gabapentin which has been very helpful  In addition she also complains of intermittent bilateral hands tremor, fairly symmetric, no limitation in her daily function    REVIEW OF SYSTEMS: Full 14 system review of systems performed and notable only for as above All other review of systems were negative.  PHYSICAL EXAM   Vitals:  06/24/20 0958  BP: 118/78  Pulse: 66  Weight: 190 lb (86.2 kg)  Height: 5\' 4"  (1.626 m)   Not recorded     Body mass index is 32.61 kg/m.  PHYSICAL EXAMNIATION:  Gen: NAD, conversant, well nourised, well groomed                     Cardiovascular: Regular rate rhythm, no peripheral edema, warm, nontender. Eyes: Conjunctivae clear without exudates or hemorrhage Neck: Supple, no carotid bruits. Pulmonary: Clear to auscultation bilaterally   NEUROLOGICAL EXAM:  MENTAL STATUS: Speech:    Speech is  normal; fluent and spontaneous with normal comprehension.  Cognition:     Orientation to time, place and person     Normal recent and remote memory     Normal Attention span and concentration     Normal Language, naming, repeating,spontaneous speech     Fund of knowledge   CRANIAL NERVES: CN II: Visual fields are full to confrontation. Pupils are round equal and briskly reactive to light. CN III, IV, VI: extraocular movement are normal. No ptosis. CN V: Facial sensation is intact to light touch CN VII: Face is symmetric with normal eye closure  CN VIII: Hearing is normal to causal conversation. CN IX, X: Phonation is normal. CN XI: Head turning and shoulder shrug are intact  MOTOR: There is no pronator drift of out-stretched arms. Muscle bulk and tone are normal. Muscle strength is normal.  REFLEXES: Reflexes are 2+ and symmetric at the biceps, triceps, knees, and ankles. Plantar responses are flexor.  SENSORY: Intact to light touch, pinprick and vibratory sensation are intact in fingers and toes, with exception of decreased light touch, pinprick,  palm-sized at bilateral lateral thigh above bilaterally knee  COORDINATION: There is no trunk or limb dysmetria noted.  GAIT/STANCE: Posture is normal. Gait is steady with normal steps, base, arm swing, and turning. Heel and toe walking are normal. Tandem gait is normal.  Romberg is absent.  ALLERGIES: Allergies  Allergen Reactions  . Penicillins     Has patient had a PCN reaction causing immediate rash, facial/tongue/throat swelling, SOB or lightheadedness with hypotension: YES Has patient had a PCN reaction causing severe rash involving mucus membranes or skin necrosis: NO Has patient had a PCN reaction that required hospitalization NO Has patient had a PCN reaction occurring within the last 10 years: NO If all of the above answers are "NO", then may proceed with Cephalosporin use.  . Other     Fluoroquinolone--- Rash   .  Sulfa Antibiotics Hives  . Bactrim [Sulfamethoxazole-Trimethoprim] Rash    HOME MEDICATIONS: Current Outpatient Medications  Medication Sig Dispense Refill  . ALPRAZolam (XANAX) 0.25 MG tablet TAKE 1 TABLET BY MOUTH 3  TIMES DAILY AS NEEDED FOR  ANXIETY (Patient taking differently: Take 0.25 mg by mouth 3 (three) times daily as needed for anxiety.) 90 tablet 1  . carbidopa-levodopa (SINEMET IR) 25-100 MG tablet TAKE 2 TABLETS BY MOUTH AT  BEDTIME 180 tablet 1  . chlorpheniramine (CHLOR-TRIMETON) 4 MG tablet Take 8 mg by mouth at bedtime.    . Cholecalciferol (VITAMIN D3) 125 MCG (5000 UT) TABS Take 5,000 Units by mouth daily.    . diclofenac Sodium (VOLTAREN) 1 % GEL Apply 1 application topically 2 (two) times daily. Applied to right hand    . escitalopram (LEXAPRO) 5 MG tablet Take 5 mg by mouth daily.    Marland Kitchen estradiol (VIVELLE-DOT) 0.05 MG/24HR patch Place 1 patch onto the  skin 2 (two) times a week. Mondays & Thursdays.  3  . ferrous sulfate 325 (65 FE) MG tablet Take 325 mg by mouth daily with breakfast.    . gabapentin (NEURONTIN) 300 MG capsule Take 300 mg by mouth 2 (two) times daily.    Marland Kitchen levonorgestrel (MIRENA) 20 MCG/24HR IUD 1 each by Intrauterine route once.    . metoprolol tartrate (LOPRESSOR) 25 MG tablet Take 12.5 mg by mouth daily.     . progesterone (PROMETRIUM) 100 MG capsule Take 100 mg by mouth at bedtime.    Marland Kitchen rOPINIRole (REQUIP) 0.5 MG tablet TAKE 1 TABLET BY MOUTH AT  BEDTIME (Patient taking differently: Take 0.5 mg by mouth at bedtime.) 90 tablet 3   No current facility-administered medications for this visit.    PAST MEDICAL HISTORY: Past Medical History:  Diagnosis Date  . Anemia   . IBS (irritable bowel syndrome)   . PVC (premature ventricular contraction)   . Sleep apnea   . Yeast infection     PAST SURGICAL HISTORY: Past Surgical History:  Procedure Laterality Date  . BIOPSY  04/10/2019   Procedure: BIOPSY;  Surgeon: Juanita Craver, MD;  Location: [facility];  Service: Endoscopy;;  . BIOPSY  05/28/2020   Procedure: BIOPSY;  Surgeon: Carol Ada, MD;  Location: [facility];  Service: Endoscopy;;  . COLONOSCOPY WITH PROPOFOL N/A 04/10/2019   Procedure: COLONOSCOPY WITH PROPOFOL;  Surgeon: Juanita Craver, MD;  Location: [facility];  Service: Endoscopy;  Laterality: N/A;  . FACIAL COSMETIC SURGERY    . FLEXIBLE SIGMOIDOSCOPY N/A 05/28/2020   Procedure: FLEXIBLE SIGMOIDOSCOPY;  Surgeon: Carol Ada, MD;  Location: [facility];  Service: Endoscopy;  Laterality: N/A;  . LASIK    . POLYPECTOMY  04/10/2019   Procedure: POLYPECTOMY;  Surgeon: Juanita Craver, MD;  Location: [facility];  Service: Endoscopy;;  . POLYPECTOMY  05/28/2020   Procedure: POLYPECTOMY;  Surgeon: Carol Ada, MD;  Location: [facility];  Service: Endoscopy;;  . [name]  04/10/2019   Procedure: [name];  Surgeon: Juanita Craver, MD;  Location: [facility];  Service: Endoscopy;;    FAMILY HISTORY: Family History  Problem Relation Age of Onset  . Colon cancer Maternal Grandfather   . Heart disease Father   . Stroke Father   . Hypertension Father   . Hyperlipidemia Father   . Heart attack Father   . Neuropathy Father   . Heart disease Brother   . Stroke Brother   . Hypertension Brother   . Diverticulitis Mother   . Alcohol abuse Brother     SOCIAL HISTORY: Social History   Socioeconomic History  . Marital status: Divorced    Spouse name: Not on file  . Number of children: Not on file  . Years of education: Not on file  . Highest education level: Not on file  Occupational History  . Occupation: distribution center    Comment: [employer]  Tobacco Use  . Smoking status: Never Smoker  . Smokeless tobacco: Never Used  Vaping Use  . Vaping Use: Never used  Substance and Sexual Activity  . Alcohol use: Yes    Comment: Occasional  . Drug use: No  . Sexual activity: Not Currently    Birth control/protection: I.U.D.    Comment: MIRENA inserted  09-20-09  Other Topics Concern  . Not on file  Social History Narrative  . Not on file   Social Determinants of Health   Financial Resource Strain: Not on file  Food Insecurity: Not on  file  Transportation Needs: Not on file  Physical Activity: Not on file  Stress: Not on file  Social Connections: Not on file  Intimate Partner Violence: Not on file      Marcial Pacas, M.D. Ph.D.  [facility] [address], [place], [place] [address] Ph: [phone] Fax: [phone]  CC:  Midge Minium, MD [address] [place],  [place] [address]  Midge Minium, MD

## 2020-06-26 ENCOUNTER — Encounter: Payer: Self-pay | Admitting: Neurology

## 2020-07-02 ENCOUNTER — Encounter: Payer: Self-pay | Admitting: Family Medicine

## 2020-07-05 MED ORDER — ESCITALOPRAM OXALATE 5 MG PO TABS: 5.0000 mg | ORAL_TABLET | Freq: Every day | ORAL | 0 refills | Status: DC

## 2020-07-05 NOTE — Telephone Encounter (Signed)
Patient is requesting a refill of the following medications: Requested Prescriptions   Pending Prescriptions Disp Refills  . escitalopram (LEXAPRO) 5 MG tablet 30 tablet 0    Sig: Take 1 tablet (5 mg total) by mouth daily.    Date of patient request: 07/02/2020 Last office visit: Video 12/01/19, OV 10/13/19 Date of last refill: unknown Last refill amount: unknown Follow up time period per chart: 6 weeks   Does not appear you have prescribed this previously should I advise pt contact original prescriber?

## 2020-07-21 ENCOUNTER — Encounter: Payer: Self-pay | Admitting: Family Medicine

## 2020-07-22 ENCOUNTER — Telehealth (INDEPENDENT_AMBULATORY_CARE_PROVIDER_SITE_OTHER): Payer: 59 | Admitting: Family Medicine

## 2020-07-22 ENCOUNTER — Encounter: Payer: Self-pay | Admitting: Family Medicine

## 2020-07-22 VITALS — Ht 64.5 in | Wt 185.0 lb

## 2020-07-22 DIAGNOSIS — E669 Obesity, unspecified: Secondary | ICD-10-CM | POA: Diagnosis not present

## 2020-07-22 DIAGNOSIS — J40 Bronchitis, not specified as acute or chronic: Secondary | ICD-10-CM | POA: Diagnosis not present

## 2020-07-22 MED ORDER — PHENTERMINE HCL 37.5 MG PO CAPS: 37.5000 mg | ORAL_CAPSULE | ORAL | 0 refills | Status: DC

## 2020-07-22 MED ORDER — GUAIFENESIN-CODEINE 100-10 MG/5ML PO SYRP: 10.0000 mL | ORAL_SOLUTION | Freq: Three times a day (TID) | ORAL | 0 refills | Status: DC | PRN

## 2020-07-22 NOTE — Progress Notes (Signed)
Virtual Visit via Video   I connected with patient on 07/22/20 at 11:00 AM EDT by a video enabled telemedicine application and verified that I am speaking with the correct person using two identifiers.  Location patient: Home Location provider: Acupuncturist, Office Persons participating in the virtual visit: Patient, Provider, [place] (Katie N)  I discussed the limitations of evaluation and management by telemedicine and the availability of in person appointments. The patient expressed understanding and agreed to proceed.  Subjective:   HPI:   Obesity- current weight is 185lbs, up 7 lbs since last July.  BMI is 31.26  Was as high as 199 in January.  Pt is not able to exercise due to ongoing nerve pain.  Has been 'stuck at 185 x3-4 weeks'.  Has never been on weight loss medications in the past.  Cough- sxs started last night.  Plans to take COVID test today.  'it's burning in my chest'.  No fevers or chills.  No sinus pain/pressure.  No SOB.  Cough is dry and hacking.  ROS:   See pertinent positives and negatives per HPI.  Patient Active Problem List   Diagnosis Date Noted  . Meralgia paresthetica 06/24/2020  . Insomnia 10/13/2019  . Drooping eyelid 10/13/2019  . Physical exam 06/01/2016  . Palpitations 11/29/2015  . Anxiety and depression 11/29/2015  . Restless leg syndrome 09/28/2015  . Obstructive sleep apnea 09/22/2015    Social History   Tobacco Use  . Smoking status: Never Smoker  . Smokeless tobacco: Never Used  Substance Use Topics  . Alcohol use: Yes    Comment: Occasional    Current Outpatient Medications:  .  ALPRAZolam (XANAX) 0.25 MG tablet, TAKE 1 TABLET BY MOUTH 3  TIMES DAILY AS NEEDED FOR  ANXIETY (Patient taking differently: Take 0.25 mg by mouth 3 (three) times daily as needed for anxiety.), Disp: 90 tablet, Rfl: 1 .  carbidopa-levodopa (SINEMET IR) 25-100 MG tablet, TAKE 2 TABLETS BY MOUTH AT  BEDTIME (Patient taking differently: Take 2 tablets  by mouth at bedtime. 1.5 tablets at bedtime), Disp: 180 tablet, Rfl: 1 .  chlorpheniramine (CHLOR-TRIMETON) 4 MG tablet, Take 8 mg by mouth at bedtime., Disp: , Rfl:  .  Cholecalciferol (VITAMIN D3) 125 MCG (5000 UT) TABS, Take 5,000 Units by mouth daily., Disp: , Rfl:  .  diclofenac Sodium (VOLTAREN) 1 % GEL, Apply 1 application topically 2 (two) times daily. Applied to right hand, Disp: , Rfl:  .  escitalopram (LEXAPRO) 5 MG tablet, Take 1 tablet (5 mg total) by mouth daily., Disp: 90 tablet, Rfl: 0 .  estradiol (VIVELLE-DOT) 0.05 MG/24HR patch, Place 1 patch onto the skin 2 (two) times a week. Mondays & Thursdays., Disp: , Rfl: 3 .  ferrous sulfate 325 (65 FE) MG tablet, Take 325 mg by mouth daily with breakfast., Disp: , Rfl:  .  gabapentin (NEURONTIN) 300 MG capsule, Take 2 capsules (600 mg total) by mouth 2 (two) times daily. (Patient taking differently: Take 600 mg by mouth 2 (two) times daily. 1 tablet in AM and 2 tablet in PM), Disp: 120 capsule, Rfl: 11 .  levonorgestrel (MIRENA) 20 MCG/24HR IUD, 1 each by Intrauterine route once., Disp: , Rfl:  .  lidocaine-prilocaine (EMLA) cream, 4 gram qid prn, Disp: 30 g, Rfl: 6 .  metoprolol tartrate (LOPRESSOR) 25 MG tablet, Take 12.5 mg by mouth daily. , Disp: , Rfl:  .  progesterone (PROMETRIUM) 100 MG capsule, Take 100 mg by mouth at bedtime.,  Disp: , Rfl:  .  rOPINIRole (REQUIP) 0.5 MG tablet, TAKE 1 TABLET BY MOUTH AT  BEDTIME (Patient taking differently: Take 0.5 mg by mouth at bedtime.), Disp: 90 tablet, Rfl: 3  Allergies  Allergen Reactions  . Penicillins     Has patient had a PCN reaction causing immediate rash, facial/tongue/throat swelling, SOB or lightheadedness with hypotension: YES Has patient had a PCN reaction causing severe rash involving mucus membranes or skin necrosis: NO Has patient had a PCN reaction that required hospitalization NO Has patient had a PCN reaction occurring within the last 10 years: NO If all of the above  answers are "NO", then may proceed with Cephalosporin use.  . Other     Fluoroquinolone--- Rash   . Sulfa Antibiotics Hives  . Bactrim [Sulfamethoxazole-Trimethoprim] Rash    Objective:   Ht 5' 4.5" (1.638 m)   Wt 185 lb (83.9 kg)   BMI 31.26 kg/m   AAOx3, NAD NCAT, EOMI No obvious CN deficits Coloring WNL Pt is able to speak clearly, coherently without shortness of breath or increased work of breathing.  Thought process is linear.  Mood is appropriate.   Assessment and Plan:   Obesity- deteriorated.  Pt peaked at 199 lbs and is currently 185 w/ BMI of 31.26  She feels she has been stuck at 185 and can't get over the hump despite healthy diet.  She is limited in her ability to exercise due to ongoing nerve pain.  Will start Phentermine as a short term metabolic boost and determine at that point if additional medication is needed.  Pt expressed understanding and is in agreement w/ plan.    Bronchitis- new.  Pt's sxs are likely viral or allergic.  No need for abx.  Cough med sent.  Reviewed supportive care and red flags that should prompt return.  Pt expressed understanding and is in agreement w/ plan.    Annye Asa, MD 07/22/2020

## 2020-08-28 ENCOUNTER — Other Ambulatory Visit: Payer: Self-pay | Admitting: Family Medicine

## 2020-09-01 ENCOUNTER — Other Ambulatory Visit: Payer: Self-pay

## 2020-09-01 ENCOUNTER — Encounter: Payer: Self-pay | Admitting: Family Medicine

## 2020-09-01 ENCOUNTER — Ambulatory Visit (INDEPENDENT_AMBULATORY_CARE_PROVIDER_SITE_OTHER): Payer: 59 | Admitting: Family Medicine

## 2020-09-01 VITALS — BP 117/70 | HR 66 | Temp 98.2°F | Resp 17 | Ht 64.5 in | Wt 180.0 lb

## 2020-09-01 DIAGNOSIS — Z0000 Encounter for general adult medical examination without abnormal findings: Secondary | ICD-10-CM | POA: Diagnosis not present

## 2020-09-01 DIAGNOSIS — E669 Obesity, unspecified: Secondary | ICD-10-CM

## 2020-09-01 DIAGNOSIS — Z1159 Encounter for screening for other viral diseases: Secondary | ICD-10-CM

## 2020-09-01 LAB — CBC WITH DIFFERENTIAL/PLATELET
Basophils Absolute: 0 10*3/uL (ref 0.0–0.1)
Basophils Relative: 0.6 % (ref 0.0–3.0)
Eosinophils Absolute: 0.1 10*3/uL (ref 0.0–0.7)
Eosinophils Relative: 1.4 % (ref 0.0–5.0)
HCT: 42.6 % (ref 36.0–46.0)
Hemoglobin: 14.2 g/dL (ref 12.0–15.0)
Lymphocytes Relative: 32.6 % (ref 12.0–46.0)
Lymphs Abs: 2.4 10*3/uL (ref 0.7–4.0)
MCHC: 33.4 g/dL (ref 30.0–36.0)
MCV: 89.1 fl (ref 78.0–100.0)
Monocytes Absolute: 0.4 10*3/uL (ref 0.1–1.0)
Monocytes Relative: 5.5 % (ref 3.0–12.0)
Neutro Abs: 4.4 10*3/uL (ref 1.4–7.7)
Neutrophils Relative %: 59.9 % (ref 43.0–77.0)
Platelets: 448 10*3/uL — ABNORMAL HIGH (ref 150.0–400.0)
RBC: 4.78 Mil/uL (ref 3.87–5.11)
RDW: 13.8 % (ref 11.5–15.5)
WBC: 7.4 10*3/uL (ref 4.0–10.5)

## 2020-09-01 LAB — TSH: TSH: 2.22 u[IU]/mL (ref 0.35–4.50)

## 2020-09-01 NOTE — Patient Instructions (Signed)
Follow up in 6 months to recheck weight loss We'll notify you of your lab results and make any changes if needed Continue to work on healthy diet and regular exercise- you can do it! Call with any questions or concerns Stay Safe!  Stay Healthy! Have a great summer!!!

## 2020-09-01 NOTE — Addendum Note (Signed)
Addended by: Midge Minium on: 09/01/2020 01:21 PM   Modules accepted: Orders

## 2020-09-01 NOTE — Assessment & Plan Note (Signed)
Pt's PE WNL w/ exception of weight.  UTD on immunizations, flex sig, pap, and mammo.  Check labs.  Anticipatory guidance provided.

## 2020-09-01 NOTE — Progress Notes (Signed)
   Subjective:    Patient ID: Sandra Benitez, female    DOB: Feb 08, 1961, 60 y.o.   MRN: [id number]  HPI CPE- UTD on flu, Tdap, COVID.  UTD on flex sig, mammo, pap  Reviewed past medical, surgical, family and social histories.   Patient Care Team    Relationship Specialty Notifications Start End  Midge Minium, MD PCP - General Family Medicine  09/22/15   Delsa Bern, MD Consulting Physician Obstetrics and Gynecology  09/22/15   Particia Nearing, MD Referring Physician Gastroenterology  09/22/15   Deneise Lever, MD Consulting Physician Pulmonary Disease  11/29/15     Health Maintenance  Topic Date Due  . Hepatitis C Screening  Never done  . Zoster Vaccines- Shingrix (1 of 2) Never done  . INFLUENZA VACCINE  11/01/2020  . MAMMOGRAM  09/02/2021  . PAP SMEAR-Modifier  09/03/2022  . TETANUS/TDAP  09/22/2023  . COLONOSCOPY (Pts 45-58yrs Insurance coverage will need to be confirmed)  04/09/2024  . COVID-19 Vaccine  Completed  . HIV Screening  Completed  . HPV VACCINES  Aged Out      Review of Systems Patient reports no vision/ hearing changes, adenopathy,fever, weight change,  persistant/recurrent hoarseness , swallowing issues, chest pain, palpitations, edema, persistant/recurrent cough, hemoptysis, dyspnea (rest/exertional/paroxysmal nocturnal), gastrointestinal bleeding (melena, rectal bleeding), abdominal pain, significant heartburn, bowel changes, GU symptoms (dysuria, hematuria, incontinence), Gyn symptoms (abnormal  bleeding, pain),  syncope, focal weakness, memory loss, numbness & tingling, skin/hair/nail changes, abnormal bruising or bleeding, anxiety, or depression.   This visit occurred during the SARS-CoV-2 public health emergency.  Safety protocols were in place, including screening questions prior to the visit, additional usage of staff PPE, and extensive cleaning of exam room while observing appropriate contact time as indicated for disinfecting solutions.       Objective:    Physical Exam General Appearance:    Alert, cooperative, no distress, appears stated age  Head:    Normocephalic, without obvious abnormality, atraumatic  Eyes:    PERRL, conjunctiva/corneas clear, EOM's intact, fundi    benign, both eyes  Ears:    Normal TM's and external ear canals, both ears  Nose:   Deferred due to COVID  Throat:   Neck:   Supple, symmetrical, trachea midline, no adenopathy;    Thyroid: no enlargement/tenderness/nodules  Back:     Symmetric, no curvature, ROM normal, no CVA tenderness  Lungs:     Clear to auscultation bilaterally, respirations unlabored  Chest Wall:    No tenderness or deformity   Heart:    Regular rate and rhythm, S1 and S2 normal, no murmur, rub   or gallop  Breast Exam:    Deferred to GYN  Abdomen:     Soft, non-tender, bowel sounds active all four quadrants,    no masses, no organomegaly  Genitalia:    Deferred to GYN  Rectal:    Extremities:   Extremities normal, atraumatic, no cyanosis or edema  Pulses:   2+ and symmetric all extremities  Skin:   Skin color, texture, turgor normal, no rashes or lesions  Lymph nodes:   Cervical, supraclavicular, and axillary nodes normal  Neurologic:   CNII-XII intact, normal strength, sensation and reflexes    throughout          Assessment & Plan:

## 2020-09-02 LAB — HEPATITIS C ANTIBODY
Hepatitis C Ab: NONREACTIVE
SIGNAL TO CUT-OFF: 0.01 (ref <1.00)

## 2020-09-03 LAB — BASIC METABOLIC PANEL
BUN: 11 mg/dL (ref 6–23)
CO2: 19 mEq/L (ref 19–32)
Calcium: 10 mg/dL (ref 8.4–10.5)
Chloride: 104 mEq/L (ref 96–112)
Creatinine, Ser: 0.81 mg/dL (ref 0.40–1.20)
GFR: 80.22 mL/min (ref >60.00)
Glucose, Bld: 91 mg/dL (ref 70–99)
Potassium: 4.8 mEq/L (ref 3.5–5.1)
Sodium: 143 mEq/L (ref 135–145)

## 2020-09-03 LAB — LIPID PANEL
Cholesterol: 205 mg/dL — ABNORMAL HIGH (ref 0–200)
HDL: 47.5 mg/dL (ref >39.00)
LDL Cholesterol: 130 mg/dL — ABNORMAL HIGH (ref 0–99)
NonHDL: 157.51
Total CHOL/HDL Ratio: 4
Triglycerides: 136 mg/dL (ref 0.0–149.0)
VLDL: 27.2 mg/dL (ref 0.0–40.0)

## 2020-09-03 LAB — HEPATIC FUNCTION PANEL
ALT: 13 U/L (ref 0–35)
AST: 13 U/L (ref 0–37)
Albumin: 4.5 g/dL (ref 3.5–5.2)
Alkaline Phosphatase: 111 U/L (ref 39–117)
Bilirubin, Direct: 0.1 mg/dL (ref 0.0–0.3)
Total Bilirubin: 0.6 mg/dL (ref 0.2–1.2)
Total Protein: 7.3 g/dL (ref 6.0–8.3)

## 2020-09-29 ENCOUNTER — Encounter: Payer: Self-pay | Admitting: *Deleted

## 2020-10-02 ENCOUNTER — Other Ambulatory Visit: Payer: Self-pay | Admitting: Family Medicine

## 2020-10-23 ENCOUNTER — Encounter: Payer: Self-pay | Admitting: Internal Medicine

## 2020-10-23 NOTE — Assessment & Plan Note (Signed)
Appropriately treated for anemia. Seeing Neuro. Numb hip likely a different issue.

## 2020-10-23 NOTE — Assessment & Plan Note (Signed)
Benefits with good control. Plan- educated again on compliance goals

## 2020-10-26 ENCOUNTER — Ambulatory Visit: Payer: 59 | Admitting: Neurology

## 2020-11-30 ENCOUNTER — Other Ambulatory Visit: Payer: Self-pay | Admitting: Family Medicine

## 2020-11-30 MED ORDER — PHENTERMINE HCL 37.5 MG PO CAPS: 37.5000 mg | ORAL_CAPSULE | ORAL | 0 refills | Status: DC

## 2020-11-30 NOTE — Telephone Encounter (Signed)
LFD 07/22/20 #90 with no refills LOV 09/01/20 NOV 03/03/21

## 2020-12-14 ENCOUNTER — Other Ambulatory Visit: Payer: Self-pay | Admitting: Gastroenterology

## 2020-12-22 ENCOUNTER — Encounter (HOSPITAL_COMMUNITY): Payer: Self-pay | Admitting: Gastroenterology

## 2020-12-29 ENCOUNTER — Other Ambulatory Visit: Payer: Self-pay | Admitting: Family Medicine

## 2020-12-30 ENCOUNTER — Encounter (HOSPITAL_COMMUNITY): Payer: Self-pay | Admitting: Gastroenterology

## 2020-12-30 NOTE — Anesthesia Preprocedure Evaluation (Addendum)
Anesthesia Evaluation  Patient identified by MRN, date of birth, ID band Patient awake    Reviewed: Allergy & Precautions, NPO status , Patient's Chart, lab work & pertinent test results, reviewed documented beta blocker date and time   Airway Mallampati: III  TM Distance: >3 FB Neck ROM: Full    Dental  (+) Teeth Intact, Dental Advisory Given   Pulmonary sleep apnea and Continuous Positive Airway Pressure Ventilation ,    Pulmonary exam normal breath sounds clear to auscultation       Cardiovascular Normal cardiovascular exam+ dysrhythmias (PVCs)  Rhythm:Regular Rate:Normal     Neuro/Psych PSYCHIATRIC DISORDERS Anxiety Depression negative neurological ROS     GI/Hepatic negative GI ROS, Neg liver ROS,   Endo/Other  negative endocrine ROS  Renal/GU negative Renal ROS  negative genitourinary   Musculoskeletal negative musculoskeletal ROS (+)   Abdominal   Peds  Hematology  (+) Blood dyscrasia, anemia ,   Anesthesia Other Findings   Reproductive/Obstetrics                            Anesthesia Physical Anesthesia Plan  ASA: 3  Anesthesia Plan: MAC   Post-op Pain Management:    Induction: Intravenous  PONV Risk Score and Plan: 2 and Propofol infusion and Treatment may vary due to age or medical condition  Airway Management Planned: Natural Airway  Additional Equipment:   Intra-op Plan:   Post-operative Plan:   Informed Consent: I have reviewed the patients History and Physical, chart, labs and discussed the procedure including the risks, benefits and alternatives for the proposed anesthesia with the patient or authorized representative who has indicated his/her understanding and acceptance.     Dental advisory given  Plan Discussed with: CRNA  Anesthesia Plan Comments:        Anesthesia Quick Evaluation

## 2020-12-31 ENCOUNTER — Other Ambulatory Visit: Payer: Self-pay

## 2020-12-31 ENCOUNTER — Ambulatory Visit (HOSPITAL_COMMUNITY)
Admission: RE | Admit: 2020-12-31 | Discharge: 2020-12-31 | Disposition: A | Discharge status: Home / Self Care | Payer: 59 | Attending: Gastroenterology | Admitting: Gastroenterology

## 2020-12-31 ENCOUNTER — Ambulatory Visit (HOSPITAL_COMMUNITY): Payer: 59 | Admitting: Anesthesiology

## 2020-12-31 ENCOUNTER — Encounter (HOSPITAL_COMMUNITY): Payer: Self-pay | Admitting: Gastroenterology

## 2020-12-31 ENCOUNTER — Encounter (HOSPITAL_COMMUNITY)
Admission: RE | Disposition: A | Discharge status: Home / Self Care | Payer: Self-pay | Source: Home / Self Care | Attending: Gastroenterology

## 2020-12-31 DIAGNOSIS — G253 Myoclonus: Secondary | ICD-10-CM | POA: Insufficient documentation

## 2020-12-31 DIAGNOSIS — K621 Rectal polyp: Secondary | ICD-10-CM | POA: Insufficient documentation

## 2020-12-31 DIAGNOSIS — Z880 Allergy status to penicillin: Secondary | ICD-10-CM | POA: Insufficient documentation

## 2020-12-31 DIAGNOSIS — Z09 Encounter for follow-up examination after completed treatment for conditions other than malignant neoplasm: Secondary | ICD-10-CM | POA: Diagnosis present

## 2020-12-31 DIAGNOSIS — Z882 Allergy status to sulfonamides status: Secondary | ICD-10-CM | POA: Insufficient documentation

## 2020-12-31 DIAGNOSIS — D128 Benign neoplasm of rectum: Secondary | ICD-10-CM | POA: Diagnosis not present

## 2020-12-31 HISTORY — PX: POLYPECTOMY: SHX5525

## 2020-12-31 HISTORY — PX: FLEXIBLE SIGMOIDOSCOPY: SHX5431

## 2020-12-31 SURGERY — SIGMOIDOSCOPY, FLEXIBLE
Anesthesia: Monitor Anesthesia Care

## 2020-12-31 MED ORDER — PROPOFOL 500 MG/50ML IV EMUL
INTRAVENOUS | Status: AC
  Filled 2020-12-31: qty 50

## 2020-12-31 MED ORDER — ESCITALOPRAM OXALATE 5 MG PO TABS: 5.0000 mg | ORAL_TABLET | Freq: Every day | ORAL | 0 refills | Status: DC

## 2020-12-31 MED ORDER — PROPOFOL 500 MG/50ML IV EMUL
INTRAVENOUS | Status: DC | PRN
  Administered 2020-12-31: 30 mg via INTRAVENOUS
  Administered 2020-12-31: 10 mg via INTRAVENOUS
  Administered 2020-12-31: 20 mg via INTRAVENOUS

## 2020-12-31 MED ORDER — PROPOFOL 10 MG/ML IV BOLUS
INTRAVENOUS | Status: AC
  Filled 2020-12-31: qty 20

## 2020-12-31 MED ORDER — LACTATED RINGERS IV SOLN
INTRAVENOUS | Status: DC
  Administered 2020-12-31: 1000 mL via INTRAVENOUS

## 2020-12-31 MED ORDER — PROPOFOL 500 MG/50ML IV EMUL
INTRAVENOUS | Status: DC | PRN
  Administered 2020-12-31: 140 ug/kg/min via INTRAVENOUS

## 2020-12-31 MED ORDER — LACTATED RINGERS IV SOLN: INTRAVENOUS | Status: DC | PRN

## 2020-12-31 NOTE — Anesthesia Postprocedure Evaluation (Signed)
Anesthesia Post Note  Patient: Sandra Benitez  Procedure(s) Performed: FLEXIBLE SIGMOIDOSCOPY POLYPECTOMY     Patient location during evaluation: Endoscopy Anesthesia Type: MAC Level of consciousness: awake and alert Pain management: pain level controlled Vital Signs Assessment: post-procedure vital signs reviewed and stable Respiratory status: spontaneous breathing, nonlabored ventilation, respiratory function stable and patient connected to nasal cannula oxygen Cardiovascular status: blood pressure returned to baseline and stable Postop Assessment: no apparent nausea or vomiting Anesthetic complications: no   No notable events documented.  Last Vitals:  Vitals:   12/31/20 0930 12/31/20 0932  BP: 107/67 109/63  Pulse: 63 (!) 33  Resp: 18 14  Temp:    SpO2: 100% 100%    Last Pain:  Vitals:   12/31/20 0758  TempSrc: Oral  PainSc: 0-No pain                 Imanni Burdine L Ethal Gotay

## 2020-12-31 NOTE — Op Note (Signed)
[facility] Patient Name: Sandra Benitez Procedure Date: 12/31/2020 MRN: [id number] Attending MD: Carol Ada , MD Date of Birth: January 19, 1961 CSN: [id number] Age: 60 Admit Type: Outpatient Procedure:                Flexible Sigmoidoscopy Indications:              Personal history of colonic polyps Providers:                Carol Ada, MD, Elmer Ramp. Tilden Dome, RN, Benetta Spar, Technician Referring MD:              Medicines:                Propofol per Anesthesia Complications:            No immediate complications. Estimated Blood Loss:     Estimated blood loss was minimal. Procedure:                Pre-Anesthesia Assessment:                           - Prior to the procedure, a History and Physical                            was performed, and patient medications and                            allergies were reviewed. The patient's tolerance of                            previous anesthesia was also reviewed. The risks                            and benefits of the procedure and the sedation                            options and risks were discussed with the patient.                            All questions were answered, and informed consent                            was obtained. Prior Anticoagulants: The patient has                            taken no previous anticoagulant or antiplatelet                            agents. ASA Grade Assessment: II - A patient with                            mild systemic disease. After reviewing the risks  and benefits, the patient was deemed in                            satisfactory condition to undergo the procedure.                           - Sedation was administered by an anesthesia                            professional. Deep sedation was attained.                           After obtaining informed consent, the scope was                            passed under direct  vision. The [id number]-H190 ([id number])                            Olympus endoscope was introduced through the anus                            and advanced to the the sigmoid colon. The flexible                            sigmoidoscopy was technically difficult and                            complex. The quality of the bowel preparation was                            excellent. Scope In: Scope Out: Findings:      Two sessile polyps were found in the rectum. The polyps were 1 to 3 mm       in size. These polyps were removed with a cold snare. Resection and       retrieval were complete.      The procedure was technically difficult as the patient displayed       myoclonic jerking. The prior polypectomy site was identified in the 6       O'clock position and there was evidence of a scar, but there was the       possibility of a new small polyp versus a residual polyp. This was       removed completely with a combination of a cold snare and cold biopsy       forceps. In the 3 O'clock forward facing position a possible new polyp       was identified. With NBI it appeared to be a new polyp and it was       removed completely with a cold snare. Both were placed in separate       bottles: 3 O'clock position was labeled bottle 1 and the 6 O'clock       position was labeled bottle 2. Impression:               - Two 1 to 3 mm polyps in the rectum, removed with  a cold snare. Resected and retrieved. Moderate Sedation:      Not Applicable - Patient had care per Anesthesia. Recommendation:           - Patient has a contact number available for                            emergencies. The signs and symptoms of potential                            delayed complications were discussed with the                            patient. Return to normal activities tomorrow.                            Written discharge instructions were provided to the                            patient.                            - Resume previous diet. Procedure Code(s):        --- Professional ---                           [phone], Sigmoidoscopy, flexible; with removal of                            tumor(s), polyp(s), or other lesion(s) by snare                            technique Diagnosis Code(s):        --- Professional ---                           K62.1, Rectal polyp                           Z86.010, Personal history of colonic polyps CPT copyright 2019 [employer]. All rights reserved. The codes documented in this report are preliminary and upon coder review may  be revised to meet current compliance requirements. Carol Ada, MD Carol Ada, MD 12/31/2020 9:17:09 AM This report has been signed electronically. Number of Addenda: 0

## 2020-12-31 NOTE — Discharge Instructions (Signed)

## 2020-12-31 NOTE — Transfer of Care (Signed)
Immediate Anesthesia Transfer of Care Note  Patient: Sandra Benitez  Procedure(s) Performed: FLEXIBLE SIGMOIDOSCOPY POLYPECTOMY  Patient Location: Endoscopy Unit  Anesthesia Type:MAC  Level of Consciousness: awake  Airway & Oxygen Therapy: Patient Spontanous Breathing and Patient connected to face mask oxygen  Post-op Assessment: Report given to RN and Post -op Vital signs reviewed and stable  Post vital signs: Reviewed and stable  Last Vitals:  Vitals Value Taken Time  BP 90/50 12/31/20 0907  Temp    Pulse 78 12/31/20 0910  Resp 21 12/31/20 0910  SpO2 100 % 12/31/20 0910  Vitals shown include unvalidated device data.  Last Pain:  Vitals:   12/31/20 0758  TempSrc: Oral  PainSc: 0-No pain         Complications: No notable events documented.

## 2020-12-31 NOTE — H&P (Signed)
Sandra Benitez HPI: The patient returns for a 6 month follow up [place] of an anal verge polyp.  It was initially identified in 04/2019 and follow up [place] on 05/2020 was performed.  There was still evidence of residual polyp and it was removed with a cold snare.  Past Medical History:  Diagnosis Date   Anemia    IBS (irritable bowel syndrome)    PVC (premature ventricular contraction)    Sleep apnea    Yeast infection     Past Surgical History:  Procedure Laterality Date   BIOPSY  04/10/2019   Procedure: BIOPSY;  Surgeon: Juanita Craver, MD;  Location: [facility];  Service: Endoscopy;;   BIOPSY  05/28/2020   Procedure: BIOPSY;  Surgeon: Carol Ada, MD;  Location: [facility];  Service: Endoscopy;;   COLONOSCOPY WITH PROPOFOL N/A 04/10/2019   Procedure: COLONOSCOPY WITH PROPOFOL;  Surgeon: Juanita Craver, MD;  Location: [facility];  Service: Endoscopy;  Laterality: N/A;   FACIAL COSMETIC SURGERY     FLEXIBLE SIGMOIDOSCOPY N/A 05/28/2020   Procedure: FLEXIBLE SIGMOIDOSCOPY;  Surgeon: Carol Ada, MD;  Location: [facility];  Service: Endoscopy;  Laterality: N/A;   LASIK     POLYPECTOMY  04/10/2019   Procedure: POLYPECTOMY;  Surgeon: Juanita Craver, MD;  Location: [facility];  Service: Endoscopy;;   POLYPECTOMY  05/28/2020   Procedure: POLYPECTOMY;  Surgeon: Carol Ada, MD;  Location: [facility];  Service: Endoscopy;;   SCLEROTHERAPY  04/10/2019   Procedure: [name];  Surgeon: Juanita Craver, MD;  Location: [facility];  Service: Endoscopy;;    Family History  Problem Relation Age of Onset   Colon cancer Maternal Grandfather    Heart disease Father    Stroke Father    Hypertension Father    Hyperlipidemia Father    Heart attack Father    Neuropathy Father    Heart disease Brother    Stroke Brother    Hypertension Brother    Diverticulitis Mother    Alcohol abuse Brother     Social History:  reports that she has never smoked. She has never used smokeless tobacco. She reports  current alcohol use. She reports that she does not use drugs.  Allergies:  Allergies  Allergen Reactions   Penicillins Hives    Has patient had a PCN reaction causing immediate rash, facial/tongue/throat swelling, SOB or lightheadedness with hypotension: NO Has patient had a PCN reaction causing severe rash involving mucus membranes or skin necrosis: NO Has patient had a PCN reaction that required hospitalization NO Has patient had a PCN reaction occurring within the last 10 years: NO If all of the above answers are "NO", then may proceed with Cephalosporin use.   Other Hives    Fluoroquinolone--   Sulfa Antibiotics Hives   Bactrim [Sulfamethoxazole-Trimethoprim] Hives    Medications: Scheduled: Continuous:  No results found for this or any previous visit (from the past 24 hour(s)).   No results found.  ROS:  As stated above in the HPI otherwise negative.  There were no vitals taken for this visit.    PE: Gen: NAD, Alert and Oriented HEENT:  [place]/AT, EOMI Neck: Supple, no LAD Lungs: CTA Bilaterally CV: RRR without M/G/R ABD: Soft, NTND, +BS Ext: No C/C/E  Assessment/Plan: 1) History of rectal polyp - FFS with possible polypectomy.  Skyllar Notarianni D 12/31/2020, 7:23 AM

## 2021-01-03 ENCOUNTER — Encounter (HOSPITAL_COMMUNITY): Payer: Self-pay | Admitting: Gastroenterology

## 2021-01-03 LAB — SURGICAL PATHOLOGY

## 2021-02-10 ENCOUNTER — Other Ambulatory Visit: Payer: Self-pay | Admitting: Family Medicine

## 2021-02-11 ENCOUNTER — Other Ambulatory Visit: Payer: Self-pay

## 2021-02-11 MED ORDER — ESTRADIOL 0.05 MG/24HR TD PTTW
1.0000 | MEDICATED_PATCH | TRANSDERMAL | 3 refills | Status: AC
  Filled 2021-02-11: qty 8, 28d supply, fill #0

## 2021-02-14 ENCOUNTER — Other Ambulatory Visit: Payer: Self-pay

## 2021-02-18 ENCOUNTER — Other Ambulatory Visit: Payer: Self-pay

## 2021-02-21 ENCOUNTER — Other Ambulatory Visit: Payer: Self-pay

## 2021-03-03 ENCOUNTER — Ambulatory Visit: Payer: 59 | Admitting: Family Medicine

## 2021-03-21 ENCOUNTER — Telehealth: Payer: Self-pay

## 2021-03-21 NOTE — Telephone Encounter (Signed)
Caller name:[facility]   On DPR? :yes  Call back [phone]  Provider they see: Birdie Riddle   Reason for call:Ref # [id number] Pharmacy calling saying they got a request back on 11/10 for  rOPINIRole (REQUIP) .25MG  tablet  Was sent but on the system it is showing 0.5 mg pharmacy is wanting to know which dosage pt needs to take pharmacy sent out .25 mg on 11/10 should the pt double up on these if not they will need a corrected script  They are saying that it was a computer glitch it appeared to be .25 mg

## 2021-03-22 NOTE — Telephone Encounter (Signed)
Spoke to pharmacy, confirmed the 0.5 mg dosing. They stated they are correcting this issue and getting the correct dosing to patient

## 2021-03-23 ENCOUNTER — Other Ambulatory Visit: Payer: Self-pay | Admitting: Family Medicine

## 2021-04-01 ENCOUNTER — Other Ambulatory Visit: Payer: Self-pay | Admitting: Family Medicine

## 2021-04-18 ENCOUNTER — Ambulatory Visit (INDEPENDENT_AMBULATORY_CARE_PROVIDER_SITE_OTHER): Payer: 59 | Admitting: Family Medicine

## 2021-04-18 ENCOUNTER — Encounter: Payer: Self-pay | Admitting: Family Medicine

## 2021-04-18 VITALS — BP 124/84 | HR 84 | Temp 97.9°F | Resp 16 | Wt 178.8 lb

## 2021-04-18 DIAGNOSIS — R41840 Attention and concentration deficit: Secondary | ICD-10-CM

## 2021-04-18 DIAGNOSIS — Z7184 Encounter for health counseling related to travel: Secondary | ICD-10-CM

## 2021-04-18 MED ORDER — ATOMOXETINE HCL 40 MG PO CAPS: 40.0000 mg | ORAL_CAPSULE | Freq: Every day | ORAL | 3 refills | Status: DC

## 2021-04-18 NOTE — Patient Instructions (Signed)
Follow up in 1 month to recheck mood and attention START the Strattera once daily Try and make notes to keep yourself on track Call with any questions or concerns Stay Safe!  Stay Healthy! Happy New Year!!

## 2021-04-18 NOTE — Progress Notes (Signed)
° °  Subjective:    Patient ID: Sandra Benitez, female    DOB: 12/08/1960, 61 y.o.   MRN: [id number]  HPI Difficulty w/ focus- pt reports she is unable to stay on task.  States that she will jump from thing to thing- not finishing what she is doing.  Including conversations.  Hard to pay attention in meetings.  Frequently making to do lists that don't get done.  Has been brought to her attention at work and with her family.  Sxs became notable 3-4 months ago.  No changes at that time- no change in meds.   Started seeing someone in August- he lives in [place] which has caused some stress.  Previously on Wellbutrin but did not get relief from sxs.  Travel advice- pt is going to [place] on 3/17.  Reports she is fully vaccinated for polio, MMR, UTD on Tdap.     Review of Systems For ROS see HPI   This visit occurred during the SARS-CoV-2 public health emergency.  Safety protocols were in place, including screening questions prior to the visit, additional usage of staff PPE, and extensive cleaning of exam room while observing appropriate contact time as indicated for disinfecting solutions.      Objective:   Physical Exam Vitals reviewed.  Constitutional:      General: She is not in acute distress.    Appearance: Normal appearance. She is not ill-appearing.  HENT:     Head: Normocephalic and atraumatic.  Eyes:     Extraocular Movements: Extraocular movements intact.     Conjunctiva/sclera: Conjunctivae normal.     Pupils: Pupils are equal, round, and reactive to light.  Cardiovascular:     Rate and Rhythm: Normal rate and regular rhythm.  Pulmonary:     Effort: Pulmonary effort is normal. No respiratory distress.     Breath sounds: Normal breath sounds.  Skin:    General: Skin is warm and dry.  Neurological:     General: No focal deficit present.     Mental Status: She is alert and oriented to person, place, and time.  Psychiatric:        Mood and Affect: Mood normal.        Behavior: Behavior  normal.        Thought Content: Thought content normal.          Assessment & Plan:  Travel advice- pt is going to [place].  No specific vaccines or preventative measures needed per CDC.  Pt expressed understanding.

## 2021-04-19 ENCOUNTER — Encounter: Payer: Self-pay | Admitting: Family Medicine

## 2021-04-19 NOTE — Assessment & Plan Note (Signed)
Deteriorated.  Pt has had this issue in the past but was able to get back on track.  At that time she was started on Wellbutrin but she didn't find it effective and didn't like how it made her feel.  She is now having sxs to the point they are interfering both at work and in her personal life.  Will start Strattera daily and monitor for improvement.  Pt expressed understanding and is in agreement w/ plan.

## 2021-04-26 ENCOUNTER — Other Ambulatory Visit: Payer: Self-pay

## 2021-04-26 MED ORDER — ATOMOXETINE HCL 40 MG PO CAPS: 40.0000 mg | ORAL_CAPSULE | Freq: Every day | ORAL | 1 refills | Status: DC

## 2021-04-26 NOTE — Progress Notes (Signed)
Prescription sent to mail order as requested.

## 2021-05-15 NOTE — Progress Notes (Signed)
HPI female never smoker followed for OSA,  restless leg syndrome, complicated by anemia, anxiety, IBS Unattended Home Sleep Test-11/10/2015-severe obstructive sleep apnea-AHI 46.6/hour, desaturation to 81%, body weight 168 pounds -----------------------------------------------------------------------------------------------------------------    06/14/20- 62--year-old female never smoker followed for OSA,  restless leg syndrome, Insomnia,complicated by IBS, Anxiety/ Depression,  CPAP auto 5-15/ [facility] Sinemet for Restless Legs 25-100 and Requip by PCP Download- compliance 63%, AHI 1.4/ hr Body weight today-192 lbs Covid vax-3 Phizer Flu vax-had -----Patient is sleeping good ,machine is working good. No concerns Reviewed dowwnload. Needs to emphasize compliance.  Restless legs- treated for anemia, saw Neurologist and going for revisit- numbness in hip.  05/17/21- 89--year-old female never smoker followed for OSA, Insomnia,  restless leg syndrome,complicated by IBS, Anxiety/ Depression,  -Requip, Sinemet,  CPAP auto 5-15/ [facility] Sinemet for Restless Legs 25-100 and Requip by PCP Download- compliance 7%, AHI 0.7/ hr Body weight today-177 lbs Covid vax-4 Phizer Flu vax-had -----Patient is doing good, no concerns Download reviewed and compliance goals emphasized.  She says pressure was getting too high.  She changed to a nasal mask. Her primary physician manages restless legs. Otherwise insomnia has done a little better.  Xanax helps.  ROS-see HPI    + = positive Constitutional:    weight loss, night sweats, fevers, chills, fatigue, lassitude. HEENT:    headaches, difficulty swallowing, tooth/dental problems, sore throat,       sneezing, itching, ear ache, nasal congestion, post nasal drip, snoring CV:    chest pain, orthopnea, PND, swelling in lower extremities, anasarca,                                                     dizziness, palpitations Resp:   shortness of breath with  exertion or at rest.                productive cough,   non-productive cough, coughing up of blood.              change in color of mucus.  wheezing.   Skin:    rash or lesions. GI:  No-   heartburn, indigestion, abdominal pain, nausea, vomiting, diarrhea,                 change in bowel habits, loss of appetite GU: dysuria, change in color of urine, no urgency or frequency.   flank pain. MS:   joint pain, stiffness, decreased range of motion, back pain. Neuro-   +HPI Psych:  change in mood or affect.  depression or anxiety.   memory loss.  OBJ- Physical Exam General- Alert, Oriented, Affect-appropriate, Distress- none acute, + overweight Skin- rash-none, lesions- none, excoriation- none Lymphadenopathy- none Head- atraumatic            Eyes- Gross vision intact, PERRLA, conjunctivae and secretions clear            Ears- Hearing, canals-normal            Nose- Clear, no-Septal dev, mucus, polyps, erosion, perforation             Throat- Mallampati III-IV , mucosa clear , drainage- none, tonsils- atrophic Neck- flexible , trachea midline, no stridor , thyroid nl, carotid no bruit Chest - symmetrical excursion , unlabored           Heart/CV- RRR , no murmur ,  no gallop  , no rub, nl s1 s2                           - JVD- none , edema- none, stasis changes- none, varices- none           Lung- clear to P&A, wheeze- none, cough- none , dullness-none, rub- none           Chest wall-  Abd-  Br/ Gen/ Rectal- Not done, not indicated Extrem- cyanosis- none, clubbing, none, atrophy- none, strength- nl Neuro-not restlesslegs are not moving while she is here.

## 2021-05-17 ENCOUNTER — Other Ambulatory Visit: Payer: Self-pay

## 2021-05-17 ENCOUNTER — Ambulatory Visit (INDEPENDENT_AMBULATORY_CARE_PROVIDER_SITE_OTHER): Payer: 59 | Admitting: Internal Medicine

## 2021-05-17 ENCOUNTER — Encounter: Payer: Self-pay | Admitting: Internal Medicine

## 2021-05-17 VITALS — BP 110/80 | HR 60 | Temp 97.8°F | Ht 64.5 in | Wt 177.0 lb

## 2021-05-17 DIAGNOSIS — G4733 Obstructive sleep apnea (adult) (pediatric): Secondary | ICD-10-CM | POA: Diagnosis not present

## 2021-05-17 DIAGNOSIS — F5101 Primary insomnia: Secondary | ICD-10-CM | POA: Diagnosis not present

## 2021-05-17 NOTE — Patient Instructions (Signed)
Order- DME [facility] please change autopap range to 4-10, continue mask of choice, humidifier, supplies, AirView/ card  Please call if we can help

## 2021-05-19 ENCOUNTER — Ambulatory Visit (INDEPENDENT_AMBULATORY_CARE_PROVIDER_SITE_OTHER): Payer: 59 | Admitting: Family Medicine

## 2021-05-19 ENCOUNTER — Encounter: Payer: Self-pay | Admitting: Family Medicine

## 2021-05-19 VITALS — BP 114/80 | HR 75 | Temp 97.8°F | Resp 16 | Wt 176.4 lb

## 2021-05-19 DIAGNOSIS — R41840 Attention and concentration deficit: Secondary | ICD-10-CM | POA: Diagnosis not present

## 2021-05-19 NOTE — Assessment & Plan Note (Signed)
Improved w/ addition of Strattera.  She is having trouble w/ early evening fatigue so I encouraged her to push back the time she takes the medication in hopes of delaying her 'crash'.  Pt is agreeable to this and if it works, we may increase the dose at a later date.  Pt expressed understanding and is in agreement w/ plan.

## 2021-05-19 NOTE — Patient Instructions (Addendum)
Follow up in 3-4 weeks via MyChart to let me know how adjusting the timing goes Push the Strattera to closer to 7:30am If the sleep improves and you feel we need to increase the dose, let me know Call with any questions or concerns Keep knowing your worth!!! ENJOY YOUR CRUISE!!!

## 2021-05-19 NOTE — Progress Notes (Signed)
° °  Subjective:    Patient ID: Sandra Benitez, female    DOB: 1960/10/15, 61 y.o.   MRN: [id number]  HPI Concentration deficit- at last visit was started on Strattera.  Pt reports feeling ~70% better.  Reports feeling exceptionally tired by 7pm and is usually in bed by 8.  Takes medication around 6 am.  First phone meeting is at 7:45am.  Pt reports feeling better focused at work.  Is planning big vacation and this will be the true test of her ability to plan and focus.     Review of Systems For ROS see HPI     Objective:   Physical Exam Vitals reviewed.  Constitutional:      General: She is not in acute distress.    Appearance: Normal appearance. She is not ill-appearing.  HENT:     Head: Normocephalic and atraumatic.  Eyes:     Extraocular Movements: Extraocular movements intact.     Conjunctiva/sclera: Conjunctivae normal.     Pupils: Pupils are equal, round, and reactive to light.  Cardiovascular:     Rate and Rhythm: Normal rate and regular rhythm.  Pulmonary:     Effort: Pulmonary effort is normal. No respiratory distress.     Breath sounds: Normal breath sounds. No wheezing.  Skin:    General: Skin is warm and dry.  Neurological:     General: No focal deficit present.     Mental Status: She is alert and oriented to person, place, and time.  Psychiatric:        Mood and Affect: Mood normal.        Behavior: Behavior normal.          Assessment & Plan:

## 2021-05-23 ENCOUNTER — Encounter: Payer: Self-pay | Admitting: Family Medicine

## 2021-06-01 NOTE — Assessment & Plan Note (Signed)
Emphasis on compliance goals and comfort.  She does benefit from CPAP when used. ?Plan-reduce AutoPap range to 4-10 ?

## 2021-06-01 NOTE — Assessment & Plan Note (Signed)
Her restless leg meds and Xanax are helping some with this problem. ?

## 2021-06-02 ENCOUNTER — Ambulatory Visit (INDEPENDENT_AMBULATORY_CARE_PROVIDER_SITE_OTHER): Payer: 59 | Admitting: Family Medicine

## 2021-06-02 DIAGNOSIS — Z23 Encounter for immunization: Secondary | ICD-10-CM

## 2021-06-02 MED ORDER — POLIOVIRUS VACCINE INACTIVATED IJ INJ
0.5000 mL | INJECTION | Freq: Once | INTRAMUSCULAR | Status: AC
Start: 2021-06-02 — End: 2021-06-02
  Administered 2021-06-02: 0.5 mL via SUBCUTANEOUS

## 2021-06-02 NOTE — Progress Notes (Signed)
Pt presented for Polio vaccine previously approved by Dr Birdie Riddle to be administered. Pt reports no fever within 48 hours no allergies.  ?Vaccine administered to the Lt Deltoid without issue.  ?

## 2021-06-14 ENCOUNTER — Encounter: Payer: Self-pay | Admitting: Family Medicine

## 2021-06-14 MED ORDER — ATOMOXETINE HCL 60 MG PO CAPS: 60.0000 mg | ORAL_CAPSULE | Freq: Every day | ORAL | 1 refills | Status: DC

## 2021-07-07 ENCOUNTER — Other Ambulatory Visit: Payer: Self-pay | Admitting: Family Medicine

## 2021-08-18 ENCOUNTER — Other Ambulatory Visit: Payer: Self-pay | Admitting: Family Medicine

## 2021-08-19 ENCOUNTER — Other Ambulatory Visit: Payer: Self-pay

## 2021-08-19 DIAGNOSIS — R002 Palpitations: Secondary | ICD-10-CM

## 2021-08-19 DIAGNOSIS — F32A Depression, unspecified: Secondary | ICD-10-CM

## 2021-08-19 MED ORDER — ESCITALOPRAM OXALATE 5 MG PO TABS: ORAL_TABLET | ORAL | 1 refills | Status: DC

## 2021-08-19 NOTE — Telephone Encounter (Signed)
Left pt a VM explaining that her Rx was sent to pharmacy

## 2021-09-02 ENCOUNTER — Encounter: Payer: Self-pay | Admitting: Family Medicine

## 2021-09-02 ENCOUNTER — Ambulatory Visit (INDEPENDENT_AMBULATORY_CARE_PROVIDER_SITE_OTHER): Payer: 59 | Admitting: Family Medicine

## 2021-09-02 VITALS — BP 128/88 | HR 86 | Temp 97.6°F | Resp 17 | Ht 64.5 in | Wt 171.0 lb

## 2021-09-02 DIAGNOSIS — Z0000 Encounter for general adult medical examination without abnormal findings: Secondary | ICD-10-CM

## 2021-09-02 DIAGNOSIS — E663 Overweight: Secondary | ICD-10-CM

## 2021-09-02 DIAGNOSIS — E785 Hyperlipidemia, unspecified: Secondary | ICD-10-CM

## 2021-09-02 LAB — CBC WITH DIFFERENTIAL/PLATELET
Basophils Absolute: 0 10*3/uL (ref 0.0–0.1)
Basophils Relative: 0.6 % (ref 0.0–3.0)
Eosinophils Absolute: 0.1 10*3/uL (ref 0.0–0.7)
Eosinophils Relative: 1.4 % (ref 0.0–5.0)
HCT: 41.8 % (ref 36.0–46.0)
Hemoglobin: 13.9 g/dL (ref 12.0–15.0)
Lymphocytes Relative: 36.3 % (ref 12.0–46.0)
Lymphs Abs: 2.2 10*3/uL (ref 0.7–4.0)
MCHC: 33.2 g/dL (ref 30.0–36.0)
MCV: 90.8 fl (ref 78.0–100.0)
Monocytes Absolute: 0.3 10*3/uL (ref 0.1–1.0)
Monocytes Relative: 4.9 % (ref 3.0–12.0)
Neutro Abs: 3.5 10*3/uL (ref 1.4–7.7)
Neutrophils Relative %: 56.8 % (ref 43.0–77.0)
Platelets: 404 10*3/uL — ABNORMAL HIGH (ref 150.0–400.0)
RBC: 4.6 Mil/uL (ref 3.87–5.11)
RDW: 13.5 % (ref 11.5–15.5)
WBC: 6.2 10*3/uL (ref 4.0–10.5)

## 2021-09-02 LAB — HEPATIC FUNCTION PANEL
ALT: 13 U/L (ref 0–35)
AST: 14 U/L (ref 0–37)
Albumin: 4.2 g/dL (ref 3.5–5.2)
Alkaline Phosphatase: 73 U/L (ref 39–117)
Bilirubin, Direct: 0.1 mg/dL (ref 0.0–0.3)
Total Bilirubin: 0.6 mg/dL (ref 0.2–1.2)
Total Protein: 7.2 g/dL (ref 6.0–8.3)

## 2021-09-02 LAB — BASIC METABOLIC PANEL
BUN: 15 mg/dL (ref 6–23)
CO2: 28 mEq/L (ref 19–32)
Calcium: 9.7 mg/dL (ref 8.4–10.5)
Chloride: 102 mEq/L (ref 96–112)
Creatinine, Ser: 0.73 mg/dL (ref 0.40–1.20)
GFR: 90.24 mL/min (ref >60.00)
Glucose, Bld: 100 mg/dL — ABNORMAL HIGH (ref 70–99)
Potassium: 4.4 mEq/L (ref 3.5–5.1)
Sodium: 136 mEq/L (ref 135–145)

## 2021-09-02 LAB — LIPID PANEL
Cholesterol: 248 mg/dL — ABNORMAL HIGH (ref 0–200)
HDL: 45.4 mg/dL (ref >39.00)
LDL Cholesterol: 168 mg/dL — ABNORMAL HIGH (ref 0–99)
NonHDL: 202.22
Total CHOL/HDL Ratio: 5
Triglycerides: 170 mg/dL — ABNORMAL HIGH (ref 0.0–149.0)
VLDL: 34 mg/dL (ref 0.0–40.0)

## 2021-09-02 LAB — TSH: TSH: 3.4 u[IU]/mL (ref 0.35–5.50)

## 2021-09-02 MED ORDER — WEGOVY 0.25 MG/0.5ML ~~LOC~~ SOAJ: 0.2500 mg | SUBCUTANEOUS | 1 refills | Status: DC

## 2021-09-02 NOTE — Assessment & Plan Note (Signed)
Pt's PE WNL.  UTD on pap, mammo, colonoscopy, immunizations.  Check labs.  Anticipatory guidance provided.  

## 2021-09-02 NOTE — Addendum Note (Signed)
Addended by: Midge Minium on: 09/02/2021 09:44 AM   Modules accepted: Orders

## 2021-09-02 NOTE — Assessment & Plan Note (Signed)
Pt's last LDL 130.  She has been trying to control this w/ diet and exercise but it is a weight related comorbidity.  Check labs and determine if medication is needed

## 2021-09-02 NOTE — Patient Instructions (Addendum)
Follow up in 6-8 weeks to recheck weight loss/[place] progress We'll notify you of your lab results and make any changes if needed Keep up the good work on healthy diet and regular exercise- you're doing great!!! Start the [place] once weekly Call with any questions or concerns Stay Safe!  Stay Healthy! Have an AMAZING trip!!!

## 2021-09-02 NOTE — Assessment & Plan Note (Signed)
Pt has lost 5 lbs since last visit which is fantastic but she is frustrated w/ her inability to lose additional weight despite changing her diet and exercising regularly.  Her BMI of 28.9 along w/ her hyperlipidemia qualify her for [facility].  Discussed appropriate use, possible side effects, and spotty insurance coverage.  Prescription sent to be used in combination w/ her healthy diet and regular exercise.  Pt expressed understanding and is in agreement w/ plan.

## 2021-09-02 NOTE — Progress Notes (Signed)
   Subjective:    Patient ID: Sandra Benitez, female    DOB: September 18, 1960, 61 y.o.   MRN: [id number]  HPI CPE- UTD on pap, mammo (12/22/20), colonoscopy, Tdap.    Patient Care Team    Relationship Specialty Notifications Start End  Midge Minium, MD PCP - General Family Medicine  09/22/15   Delsa Bern, MD Consulting Physician Obstetrics and Gynecology  09/22/15   Particia Nearing, MD Referring Physician Gastroenterology  09/22/15   Deneise Lever, MD Consulting Physician Pulmonary Disease  11/29/15     Health Maintenance  Topic Date Due   Zoster Vaccines- Shingrix (1 of 2) 12/03/2021 (Originally 08/11/2012)   MAMMOGRAM  09/03/2022 (Originally 09/02/2021)   INFLUENZA VACCINE  11/01/2021   PAP SMEAR-Modifier  09/03/2022   TETANUS/TDAP  09/22/2023   COLONOSCOPY (Pts 45-42yr Insurance coverage will need to be confirmed)  04/09/2024   COVID-19 Vaccine  Completed   Hepatitis C Screening  Completed   HIV Screening  Completed   HPV VACCINES  Aged Out      Review of Systems Patient reports no vision/ hearing changes, adenopathy,fever, persistant/recurrent hoarseness , swallowing issues, chest pain, palpitations, edema, persistant/recurrent cough, hemoptysis, dyspnea (rest/exertional/paroxysmal nocturnal), gastrointestinal bleeding (melena, rectal bleeding), abdominal pain, significant heartburn, bowel changes, GU symptoms (dysuria, hematuria, incontinence), Gyn symptoms (abnormal  bleeding, pain),  syncope, focal weakness, memory loss, numbness & tingling, skin/hair/nail changes, abnormal bruising or bleeding, anxiety, or depression.   + 5 lb weight loss- pt has been doing Pilates and clean eating for at least 4 months.  Frustrated that she has plateaued.      Objective:   Physical Exam General Appearance:    Alert, cooperative, no distress, appears stated age  Head:    Normocephalic, without obvious abnormality, atraumatic  Eyes:    PERRL, conjunctiva/corneas clear, EOM's intact both eyes   Ears:    Normal TM's and external ear canals, both ears  Nose:   Nares normal, septum midline, mucosa normal, no drainage    or sinus tenderness  Throat:   Lips, mucosa, and tongue normal; teeth and gums normal  Neck:   Supple, symmetrical, trachea midline, no adenopathy;    Thyroid: no enlargement/tenderness/nodules  Back:     Symmetric, no curvature, ROM normal, no CVA tenderness  Lungs:     Clear to auscultation bilaterally, respirations unlabored  Chest Wall:    No tenderness or deformity   Heart:    Regular rate and rhythm, S1 and S2 normal, no murmur, rub   or gallop  Breast Exam:    Deferred to GYN  Abdomen:     Soft, non-tender, bowel sounds active all four quadrants,    no masses, no organomegaly  Genitalia:    Deferred to GYN  Rectal:    Extremities:   Extremities normal, atraumatic, no cyanosis or edema  Pulses:   2+ and symmetric all extremities  Skin:   Skin color, texture, turgor normal, no rashes or lesions  Lymph nodes:   Cervical, supraclavicular, and axillary nodes normal  Neurologic:   CNII-XII intact, normal strength, sensation and reflexes    throughout          Assessment & Plan:

## 2021-09-04 ENCOUNTER — Encounter: Payer: Self-pay | Admitting: Family Medicine

## 2021-09-05 ENCOUNTER — Other Ambulatory Visit: Payer: Self-pay | Admitting: Gastroenterology

## 2021-09-05 ENCOUNTER — Other Ambulatory Visit: Payer: Self-pay

## 2021-09-05 ENCOUNTER — Encounter: Payer: Self-pay | Admitting: Internal Medicine

## 2021-09-05 ENCOUNTER — Encounter: Payer: Self-pay | Admitting: Family Medicine

## 2021-09-05 DIAGNOSIS — E663 Overweight: Secondary | ICD-10-CM

## 2021-09-05 MED ORDER — WEGOVY 0.25 MG/0.5ML ~~LOC~~ SOAJ: 0.2500 mg | SUBCUTANEOUS | 1 refills | Status: DC

## 2021-09-05 MED ORDER — WEGOVY 0.25 MG/0.5ML ~~LOC~~ SOAJ: 0.2500 mg | SUBCUTANEOUS | 0 refills | Status: DC

## 2021-09-05 NOTE — Telephone Encounter (Signed)
When she did her sleep study she had severe sleep apnea. She has gained some weight since then, which tends to make apnea worse if anything. At last visit here she had hardly been using CPAP at all. I have no basis for telling her anything has improved. I advise her to follow her GI doctor's recommendations for safe colonoscopy.

## 2021-09-05 NOTE — Progress Notes (Signed)
Spoke w/ pt and advised of lab results  

## 2021-09-06 ENCOUNTER — Encounter: Payer: Self-pay | Admitting: Family Medicine

## 2021-09-07 ENCOUNTER — Telehealth: Payer: Self-pay

## 2021-09-07 MED ORDER — SAXENDA 18 MG/3ML ~~LOC~~ SOPN: PEN_INJECTOR | SUBCUTANEOUS | 1 refills | Status: DC

## 2021-09-07 NOTE — Telephone Encounter (Signed)
Responded to pt via MyChart

## 2021-09-07 NOTE — Telephone Encounter (Signed)
Dr Birdie Riddle is messaging pt Via my chart stating there are no alternative's . Pt did not have a dx of diabetes .

## 2021-09-09 ENCOUNTER — Telehealth: Payer: Self-pay

## 2021-09-09 NOTE — Telephone Encounter (Signed)
Pt got approval for Saxenda x4 months

## 2021-09-09 NOTE — Telephone Encounter (Signed)
PA form has been signed by Dr Birdie Riddle and faxed back to [employer] . A PA has been submitted thru Cover my meds as well for the Saxenda . Waiting on a response . Notes and labs are faxed with PA as well .

## 2021-09-09 NOTE — Telephone Encounter (Signed)
I spoke w/ pt and advised that the denial for Saxenda was from [place] my meds. I explained to her that  I faxed a PA form w/ lab and office notes to [place] [facility] . I explained it can take 24 to 72 hours for a response .

## 2021-09-12 NOTE — Telephone Encounter (Signed)
Pt is aware . I left her a VM

## 2021-10-05 ENCOUNTER — Other Ambulatory Visit: Payer: Self-pay

## 2021-10-05 DIAGNOSIS — F32A Depression, unspecified: Secondary | ICD-10-CM

## 2021-10-05 MED ORDER — ESCITALOPRAM OXALATE 5 MG PO TABS: ORAL_TABLET | ORAL | 1 refills | Status: DC

## 2021-10-14 ENCOUNTER — Ambulatory Visit: Payer: 59 | Admitting: Family Medicine

## 2021-10-27 ENCOUNTER — Ambulatory Visit (INDEPENDENT_AMBULATORY_CARE_PROVIDER_SITE_OTHER): Payer: 59 | Admitting: Family Medicine

## 2021-10-27 ENCOUNTER — Encounter: Payer: Self-pay | Admitting: Family Medicine

## 2021-10-27 VITALS — BP 120/76 | HR 86 | Temp 97.3°F | Resp 20 | Ht 64.0 in | Wt 159.4 lb

## 2021-10-27 DIAGNOSIS — E663 Overweight: Secondary | ICD-10-CM

## 2021-10-27 LAB — BASIC METABOLIC PANEL
BUN: 20 mg/dL (ref 6–23)
CO2: 28 mEq/L (ref 19–32)
Calcium: 9.6 mg/dL (ref 8.4–10.5)
Chloride: 101 mEq/L (ref 96–112)
Creatinine, Ser: 0.75 mg/dL (ref 0.40–1.20)
GFR: 87.27 mL/min (ref >60.00)
Glucose, Bld: 89 mg/dL (ref 70–99)
Potassium: 4.4 mEq/L (ref 3.5–5.1)
Sodium: 135 mEq/L (ref 135–145)

## 2021-10-27 NOTE — Patient Instructions (Signed)
Schedule your complete physical for June We'll notify you of your lab results and make any changes if needed Continue the [place] if you can get it Keep up the good work on healthy diet and regular exercise- you look great! Call with any questions or concerns Enjoy the rest of your summer!!!

## 2021-10-27 NOTE — Progress Notes (Signed)
   Subjective:    Patient ID: Sandra Benitez, female    DOB: [birth year], 61 y.o.   MRN: [id number]  HPI Overweight- pt is down 12 lbs on Saxenda even though she was on vacation.  Pt reports feeling great.  Unfortunately medication is now on back order.  Pt reports her cravings and the food noise have nearly ceased.  No side effects.  Denies nausea   Review of Systems For ROS see HPI     Objective:   Physical Exam Vitals reviewed.  Constitutional:      General: She is not in acute distress.    Appearance: Normal appearance. She is not ill-appearing.  HENT:     Head: Normocephalic and atraumatic.  Eyes:     Extraocular Movements: Extraocular movements intact.     Pupils: Pupils are equal, round, and reactive to light.  Cardiovascular:     Rate and Rhythm: Normal rate and regular rhythm.  Pulmonary:     Effort: Pulmonary effort is normal. No respiratory distress.     Breath sounds: No wheezing.  Skin:    General: Skin is warm and dry.  Neurological:     General: No focal deficit present.     Mental Status: She is alert and oriented to person, place, and time.  Psychiatric:        Mood and Affect: Mood normal.        Behavior: Behavior normal.        Thought Content: Thought content normal.           Assessment & Plan:

## 2021-10-27 NOTE — Assessment & Plan Note (Signed)
Pt is down 12 lbs since starting Saxenda!  She continues to work on [employer] and regular exercise.  Will continue Saxenda if she is able to get the medication.  Her goal is to take 2 more months of meds to get to her goal weight and then stop to see if she can maintain.  Will continue to follow.

## 2021-10-28 ENCOUNTER — Telehealth: Payer: Self-pay

## 2021-10-28 NOTE — Telephone Encounter (Signed)
Informed pt of lab results  

## 2021-10-28 NOTE — Telephone Encounter (Signed)
-----   Message from Midge Minium, MD sent at 10/28/2021  7:43 AM EDT ----- Labs look great!  No changes at this time

## 2021-10-31 ENCOUNTER — Encounter: Payer: Self-pay | Admitting: Family Medicine

## 2021-10-31 ENCOUNTER — Other Ambulatory Visit: Payer: Self-pay

## 2021-10-31 MED ORDER — SAXENDA 18 MG/3ML ~~LOC~~ SOPN: PEN_INJECTOR | SUBCUTANEOUS | 1 refills | Status: DC

## 2021-11-25 ENCOUNTER — Encounter (HOSPITAL_COMMUNITY): Payer: Self-pay | Admitting: Gastroenterology

## 2021-11-25 NOTE — Progress Notes (Signed)
Attempted to obtain medical history via telephone, unable to reach at this time. HIPAA compliant voicemail message left requesting return call to pre surgical testing department. 

## 2021-12-01 NOTE — Anesthesia Preprocedure Evaluation (Addendum)
Anesthesia Evaluation  Patient identified by MRN, date of birth, ID band Patient awake    Reviewed: Allergy & Precautions, NPO status , Patient's Chart, lab work & pertinent test results, reviewed documented beta blocker date and time   Airway Mallampati: III  TM Distance: <3 FB Neck ROM: Full    Dental  (+) Teeth Intact, Dental Advisory Given   Pulmonary sleep apnea and Continuous Positive Airway Pressure Ventilation ,    Pulmonary exam normal breath sounds clear to auscultation       Cardiovascular hypertension, Pt. on home beta blockers Normal cardiovascular exam+ dysrhythmias  Rhythm:Regular Rate:Normal     Neuro/Psych PSYCHIATRIC DISORDERS Anxiety Depression  Neuromuscular disease    GI/Hepatic negative GI ROS, Neg liver ROS,   Endo/Other  negative endocrine ROS  Renal/GU negative Renal ROS     Musculoskeletal negative musculoskeletal ROS (+)   Abdominal   Peds  Hematology negative hematology ROS (+)   Anesthesia Other Findings   Reproductive/Obstetrics                            Anesthesia Physical Anesthesia Plan  ASA: 2  Anesthesia Plan: MAC   Post-op Pain Management: Minimal or no pain anticipated   Induction: Intravenous  PONV Risk Score and Plan: 2 and TIVA and Treatment may vary due to age or medical condition  Airway Management Planned: Natural Airway and Nasal Cannula  Additional Equipment:   Intra-op Plan:   Post-operative Plan:   Informed Consent: I have reviewed the patients History and Physical, chart, labs and discussed the procedure including the risks, benefits and alternatives for the proposed anesthesia with the patient or authorized representative who has indicated his/her understanding and acceptance.     Dental advisory given  Plan Discussed with: CRNA  Anesthesia Plan Comments:        Anesthesia Quick Evaluation

## 2021-12-02 ENCOUNTER — Other Ambulatory Visit: Payer: Self-pay

## 2021-12-02 ENCOUNTER — Ambulatory Visit (HOSPITAL_BASED_OUTPATIENT_CLINIC_OR_DEPARTMENT_OTHER): Payer: 59

## 2021-12-02 ENCOUNTER — Ambulatory Visit (HOSPITAL_COMMUNITY)
Admission: RE | Admit: 2021-12-02 | Discharge: 2021-12-02 | Disposition: A | Discharge status: Home / Self Care | Payer: 59 | Source: Ambulatory Visit | Attending: Gastroenterology | Admitting: Gastroenterology

## 2021-12-02 ENCOUNTER — Encounter (HOSPITAL_COMMUNITY): Payer: Self-pay | Admitting: Gastroenterology

## 2021-12-02 ENCOUNTER — Encounter (HOSPITAL_COMMUNITY)
Admission: RE | Disposition: A | Discharge status: Home / Self Care | Payer: Self-pay | Source: Ambulatory Visit | Attending: Gastroenterology

## 2021-12-02 ENCOUNTER — Ambulatory Visit (HOSPITAL_COMMUNITY): Payer: 59

## 2021-12-02 DIAGNOSIS — Z1211 Encounter for screening for malignant neoplasm of colon: Secondary | ICD-10-CM | POA: Diagnosis present

## 2021-12-02 DIAGNOSIS — K621 Rectal polyp: Secondary | ICD-10-CM

## 2021-12-02 DIAGNOSIS — G4730 Sleep apnea, unspecified: Secondary | ICD-10-CM | POA: Insufficient documentation

## 2021-12-02 DIAGNOSIS — Z800 Family history of malignant neoplasm of digestive organs: Secondary | ICD-10-CM | POA: Insufficient documentation

## 2021-12-02 DIAGNOSIS — I10 Essential (primary) hypertension: Secondary | ICD-10-CM | POA: Diagnosis not present

## 2021-12-02 DIAGNOSIS — D125 Benign neoplasm of sigmoid colon: Secondary | ICD-10-CM | POA: Diagnosis not present

## 2021-12-02 DIAGNOSIS — F418 Other specified anxiety disorders: Secondary | ICD-10-CM

## 2021-12-02 DIAGNOSIS — K635 Polyp of colon: Secondary | ICD-10-CM

## 2021-12-02 HISTORY — PX: COLONOSCOPY WITH PROPOFOL: SHX5780

## 2021-12-02 HISTORY — PX: POLYPECTOMY: SHX5525

## 2021-12-02 SURGERY — COLONOSCOPY WITH PROPOFOL
Anesthesia: Monitor Anesthesia Care

## 2021-12-02 MED ORDER — PROPOFOL 500 MG/50ML IV EMUL
INTRAVENOUS | Status: DC | PRN
  Administered 2021-12-02: 200 ug/kg/min via INTRAVENOUS

## 2021-12-02 MED ORDER — LACTATED RINGERS IV SOLN
INTRAVENOUS | Status: DC
  Administered 2021-12-02: 1000 mL via INTRAVENOUS

## 2021-12-02 MED ORDER — PROPOFOL 500 MG/50ML IV EMUL
INTRAVENOUS | Status: AC
  Filled 2021-12-02: qty 100

## 2021-12-02 MED ORDER — SODIUM CHLORIDE 0.9 % IV SOLN: INTRAVENOUS | Status: DC

## 2021-12-02 MED ORDER — PROPOFOL 500 MG/50ML IV EMUL
INTRAVENOUS | Status: AC
  Filled 2021-12-02: qty 50

## 2021-12-02 MED ORDER — PROPOFOL 1000 MG/100ML IV EMUL
INTRAVENOUS | Status: AC
  Filled 2021-12-02: qty 100

## 2021-12-02 MED ORDER — DEXMEDETOMIDINE HCL IN NACL 80 MCG/20ML IV SOLN
INTRAVENOUS | Status: AC
  Filled 2021-12-02: qty 20

## 2021-12-02 MED ORDER — LIDOCAINE HCL (CARDIAC) PF 100 MG/5ML IV SOSY
PREFILLED_SYRINGE | INTRAVENOUS | Status: DC | PRN
  Administered 2021-12-02: 50 mg via INTRAVENOUS

## 2021-12-02 MED ORDER — PROPOFOL 500 MG/50ML IV EMUL
INTRAVENOUS | Status: AC
  Filled 2021-12-02: qty 250

## 2021-12-02 MED ORDER — PROPOFOL 10 MG/ML IV BOLUS
INTRAVENOUS | Status: DC | PRN
  Administered 2021-12-02: 20 mg via INTRAVENOUS
  Administered 2021-12-02: 60 mg via INTRAVENOUS

## 2021-12-02 SURGICAL SUPPLY — 22 items

## 2021-12-02 NOTE — Anesthesia Postprocedure Evaluation (Signed)
Anesthesia Post Note  Patient: Sandra Benitez  Procedure(s) Performed: COLONOSCOPY WITH PROPOFOL POLYPECTOMY     Patient location during evaluation: Endoscopy Anesthesia Type: MAC Level of consciousness: oriented, awake and alert and awake Pain management: pain level controlled Vital Signs Assessment: post-procedure vital signs reviewed and stable Respiratory status: spontaneous breathing, nonlabored ventilation, respiratory function stable and patient connected to nasal cannula oxygen Cardiovascular status: blood pressure returned to baseline and stable Postop Assessment: no headache, no backache and no apparent nausea or vomiting Anesthetic complications: no   No notable events documented.  Last Vitals:  Vitals:   12/02/21 0810 12/02/21 0820  BP: (!) 103/47 112/76  Pulse: 77 61  Resp: 13 13  Temp: 36.4 C   SpO2: 96% 96%    Last Pain:  Vitals:   12/02/21 0820  TempSrc:   PainSc: 0-No pain                 Santa Lighter

## 2021-12-02 NOTE — Transfer of Care (Signed)
Immediate Anesthesia Transfer of Care Note  Patient: Sandra Benitez  Procedure(s) Performed: COLONOSCOPY WITH PROPOFOL POLYPECTOMY  Patient Location: PACU  Anesthesia Type:MAC  Level of Consciousness: awake, alert  and oriented  Airway & Oxygen Therapy: Patient Spontanous Breathing  Post-op Assessment: Report given to RN and Post -op Vital signs reviewed and stable  Post vital signs: Reviewed and stable  Last Vitals:  Vitals Value Taken Time  BP 96/49 12/02/21 0809  Temp    Pulse 77 12/02/21 0809  Resp 13 12/02/21 0809  SpO2 96 % 12/02/21 0809  Vitals shown include unvalidated device data.  Last Pain:  Vitals:   12/02/21 0711  TempSrc: Tympanic  PainSc: 0-No pain         Complications: No notable events documented.

## 2021-12-02 NOTE — H&P (Signed)
Sandra Benitez HPI: This 61 year old white female presents to the office for colorectal cancer screening. She has 1 BM per day with no obvious blood or mucus in the stool. She has a history of iron defeciency anemia and takes Ferrous sulfate 325 mg on a daily basis. She has a good appetite and her weight has been stable. She had one episode of periumbilical pain that last 2-3 hours. The pain did not radiate and the severity reached 3/10 at its worst. The pain was relieved after laying down. She has bouts of nausea once every 2 weeks. She denies having any complaints of abdominal pain, vomiting, acid reflux, dysphagia or odynophagia. She denies having a family history of celiac sprue or IBD. Her maternal grandfather was diagnosed with colon cancer in his 45's. She had a tubular adenoma removed in 2014. She had a colonoscopy done on 04/10/2019 that revealed a few scattered diverticula, a hyperplastic polyp and fragments of a traditional serrated adenoma were removed from the distal rectum. She had an flexible sigmoidoscopy done 12/31/2020 when a hyperplastic polyp and tubular adenoma was removed.  Past Medical History:  Diagnosis Date   Anemia    IBS (irritable bowel syndrome)    PVC (premature ventricular contraction)    Sleep apnea    Yeast infection     Past Surgical History:  Procedure Laterality Date   BIOPSY  04/10/2019   Procedure: BIOPSY;  Surgeon: Juanita Craver, MD;  Location: [facility];  Service: Endoscopy;;   BIOPSY  05/28/2020   Procedure: BIOPSY;  Surgeon: Carol Ada, MD;  Location: [facility];  Service: Endoscopy;;   COLONOSCOPY WITH PROPOFOL N/A 04/10/2019   Procedure: COLONOSCOPY WITH PROPOFOL;  Surgeon: Juanita Craver, MD;  Location: [facility];  Service: Endoscopy;  Laterality: N/A;   FACIAL COSMETIC SURGERY     FLEXIBLE SIGMOIDOSCOPY N/A 05/28/2020   Procedure: FLEXIBLE SIGMOIDOSCOPY;  Surgeon: Carol Ada, MD;  Location: [facility];  Service: Endoscopy;  Laterality: N/A;    FLEXIBLE SIGMOIDOSCOPY N/A 12/31/2020   Procedure: FLEXIBLE SIGMOIDOSCOPY;  Surgeon: Carol Ada, MD;  Location: [facility];  Service: Endoscopy;  Laterality: N/A;   LASIK     POLYPECTOMY  04/10/2019   Procedure: POLYPECTOMY;  Surgeon: Juanita Craver, MD;  Location: [facility];  Service: Endoscopy;;   POLYPECTOMY  05/28/2020   Procedure: POLYPECTOMY;  Surgeon: Carol Ada, MD;  Location: [facility];  Service: Endoscopy;;   POLYPECTOMY  12/31/2020   Procedure: POLYPECTOMY;  Surgeon: Carol Ada, MD;  Location: [facility];  Service: Endoscopy;;   SCLEROTHERAPY  04/10/2019   Procedure: [name];  Surgeon: Juanita Craver, MD;  Location: [facility];  Service: Endoscopy;;    Family History  Problem Relation Age of Onset   Colon cancer Maternal Grandfather    Heart disease Father    Stroke Father    Hypertension Father    Hyperlipidemia Father    Heart attack Father    Neuropathy Father    Heart disease Brother    Stroke Brother    Hypertension Brother    Diverticulitis Mother    Alcohol abuse Brother     Social History:  reports that she has never smoked. She has never used smokeless tobacco. She reports current alcohol use. She reports that she does not use drugs.  Allergies:  Allergies  Allergen Reactions   Penicillins Hives    Has patient had a PCN reaction causing immediate rash, facial/tongue/throat swelling, SOB or lightheadedness with hypotension: NO Has patient had a PCN reaction  causing severe rash involving mucus membranes or skin necrosis: NO Has patient had a PCN reaction that required hospitalization NO Has patient had a PCN reaction occurring within the last 10 years: NO If all of the above answers are "NO", then may proceed with Cephalosporin use.   Other Hives    Fluoroquinolone--   Sulfa Antibiotics Hives   Bactrim [Sulfamethoxazole-Trimethoprim] Hives    Medications: Scheduled: Continuous:  sodium chloride     lactated ringers 1,000 mL (12/02/21  0721)    No results found for this or any previous visit (from the past 24 hour(s)).   No results found.  ROS:  As stated above in the HPI otherwise negative.  Blood pressure 125/86, pulse 64, temperature 97.8 F (36.6 C), temperature source Tympanic, resp. rate 14, height '5\' 4"'$  (1.626 m), weight 72.3 kg, SpO2 95 %.    PE: Gen: NAD, Alert and Oriented HEENT:  [place]/AT, EOMI Neck: Supple, no LAD Lungs: CTA Bilaterally CV: RRR without M/G/R ABD: Soft, NTND, +BS Ext: No C/C/E  Assessment/Plan: 1) Screeening - colonoscopy.  [name] D 12/02/2021, 7:33 AM

## 2021-12-02 NOTE — Op Note (Signed)
[facility] Patient Name: Sandra Benitez Procedure Date: 12/02/2021 MRN: [id number] Attending MD: Carol Ada , MD Date of Birth: 1960-07-01 CSN: [id number] Age: 61 Admit Type: Outpatient Procedure:                Colonoscopy Indications:              Screening for colorectal malignant neoplasm Providers:                Carol Ada, MD, Dulcy Fanny, Gloris Ham, Technician Referring MD:              Medicines:                Propofol per Anesthesia Complications:            No immediate complications. Estimated Blood Loss:     Estimated blood loss: none. Procedure:                Pre-Anesthesia Assessment:                           - Prior to the procedure, a History and Physical                            was performed, and patient medications and                            allergies were reviewed. The patient's tolerance of                            previous anesthesia was also reviewed. The risks                            and benefits of the procedure and the sedation                            options and risks were discussed with the patient.                            All questions were answered, and informed consent                            was obtained. Prior Anticoagulants: The patient has                            taken no previous anticoagulant or antiplatelet                            agents. ASA Grade Assessment: II - A patient with                            mild systemic disease. After reviewing the risks                            and  benefits, the patient was deemed in                            satisfactory condition to undergo the procedure.                           - Sedation was administered by an anesthesia                            professional. Deep sedation was attained.                           After obtaining informed consent, the colonoscope                            was passed under direct  vision. Throughout the                            procedure, the patient's blood pressure, pulse, and                            oxygen saturations were monitored continuously. The                            CF-HQ190L ([id number]) Olympus colonoscope was                            introduced through the anus and advanced to the the                            cecum, identified by appendiceal orifice and                            ileocecal valve. The colonoscopy was performed                            without difficulty. The patient tolerated the                            procedure well. The quality of the bowel                            preparation was evaluated using the BBPS [facility]                            Bowel Preparation Scale) with scores of: Right                            Colon = 3 (entire mucosa seen well with no residual                            staining, small fragments of stool or opaque  liquid), Transverse Colon = 3 (entire mucosa seen                            well with no residual staining, small fragments of                            stool or opaque liquid) and Left Colon = 3 (entire                            mucosa seen well with no residual staining, small                            fragments of stool or opaque liquid). The total                            BBPS score equals 9. The quality of the bowel                            preparation was good. The ileocecal valve,                            appendiceal orifice, and rectum were photographed. Scope In: 7:43:27 AM Scope Out: 8:05:48 AM Scope Withdrawal Time: 0 hours 17 minutes 12 seconds  Total Procedure Duration: 0 hours 22 minutes 21 seconds  Findings:      Two sessile polyps were found in the rectum and sigmoid colon. The       polyps were 2 to 3 mm in size. These polyps were removed with a cold       snare. Resection and retrieval were complete.      In the rectum, at the  dentate line there was evidence of a small polyp       measuring 2 mm. This was removed with a cold snare. It was not clear if       this was residual or a new polyp. Impression:               - Two 2 to 3 mm polyps in the rectum and in the                            sigmoid colon, removed with a cold snare. Resected                            and retrieved. Moderate Sedation:      Not Applicable - Patient had care per Anesthesia. Recommendation:           - Patient has a contact number available for                            emergencies. The signs and symptoms of potential                            delayed complications were discussed with the  patient. Return to normal activities tomorrow.                            Written discharge instructions were provided to the                            patient.                           - Resume previous diet.                           - Continue present medications.                           - Await pathology results.                           - Repeat colonoscopy in 5 years for surveillance. Procedure Code(s):        --- Professional ---                           [phone], Colonoscopy, flexible; with removal of                            tumor(s), polyp(s), or other lesion(s) by snare                            technique Diagnosis Code(s):        --- Professional ---                           Z12.11, Encounter for screening for malignant                            neoplasm of colon                           K62.1, Rectal polyp                           K63.5, Polyp of colon CPT copyright 2019 American Medical Association. All rights reserved. The codes documented in this report are preliminary and upon coder review may  be revised to meet current compliance requirements. Carol Ada, MD Carol Ada, MD 12/02/2021 8:12:43 AM This report has been signed electronically. Number of Addenda: 0

## 2021-12-04 ENCOUNTER — Encounter (HOSPITAL_COMMUNITY): Payer: Self-pay | Admitting: Gastroenterology

## 2021-12-06 LAB — SURGICAL PATHOLOGY

## 2021-12-08 ENCOUNTER — Other Ambulatory Visit: Payer: Self-pay

## 2021-12-08 DIAGNOSIS — R41840 Attention and concentration deficit: Secondary | ICD-10-CM

## 2021-12-08 MED ORDER — ATOMOXETINE HCL 60 MG PO CAPS: 60.0000 mg | ORAL_CAPSULE | Freq: Every day | ORAL | 1 refills | Status: DC

## 2021-12-15 ENCOUNTER — Other Ambulatory Visit: Payer: Self-pay | Admitting: Family Medicine

## 2021-12-15 DIAGNOSIS — F32A Depression, unspecified: Secondary | ICD-10-CM

## 2021-12-15 NOTE — Telephone Encounter (Signed)
Patient is requesting a refill of the following medications: Requested Prescriptions   Pending Prescriptions Disp Refills   rOPINIRole (REQUIP) 0.5 MG tablet [Pharmacy Med Name: rOPINIRole HCl 0.5 MG Oral Tablet] 90 tablet 3    Sig: TAKE 1 TABLET BY MOUTH AT  BEDTIME   escitalopram (LEXAPRO) 5 MG tablet [Pharmacy Med Name: Escitalopram Oxalate 5 MG Oral Tablet] 90 tablet 3    Sig: TAKE 1 TABLET BY MOUTH DAILY   Only Requip is due for a refill . Lexapro was refilled on 10/05/2021 90 tablets 1 refill    Date of patient request: 12/15/2021 Last office visit: 10/27/2021 Date of last refill: 02/10/2021 Last refill amount: 90 tablets 3 refills Follow up time period per chart: 09/28/2022

## 2021-12-20 ENCOUNTER — Telehealth: Payer: Self-pay

## 2021-12-20 ENCOUNTER — Encounter: Payer: Self-pay | Admitting: Family Medicine

## 2021-12-20 MED ORDER — WEGOVY 1.7 MG/0.75ML ~~LOC~~ SOAJ: 1.7000 mg | SUBCUTANEOUS | 3 refills | Status: DC

## 2021-12-20 NOTE — Telephone Encounter (Signed)
PA has been submitted thru [employer] Meds for pt RX Wegovy. Waiting on response

## 2021-12-20 NOTE — Telephone Encounter (Signed)
Sandra Benitez is calling about PA that was denied. Sandra Benitez call back number is [phone] and reference number is [id number]. Sandra Benitez will be faxing over that PA information today.

## 2021-12-21 NOTE — Telephone Encounter (Signed)
In her 7/27 note it states 'pt continues to work on [employer] and regular exercise'.  I don't know what they specifically need for documentation purposes, but she does exercise regularly

## 2021-12-22 LAB — HM MAMMOGRAPHY

## 2021-12-23 ENCOUNTER — Telehealth: Payer: Self-pay | Admitting: Family Medicine

## 2021-12-23 NOTE — Telephone Encounter (Signed)
Faxed appeal form to [facility] with office notes . Waiting on response pt is aware

## 2021-12-23 NOTE — Telephone Encounter (Signed)
I am waiting on the appeal form once I have that I will fax all notes again and labs . They did states that she needed to be seen 30 days prior to PA and last OV was 10/27/21

## 2021-12-23 NOTE — Telephone Encounter (Signed)
Caller name: Katie @ [facility]  On DPR? :yes/no: No  Call back number: [phone]  Provider they see: Birdie Riddle  Reason for call: needing addition info for PA  for wegovy 1.7 mg/0.62m.  Please advise.

## 2021-12-23 NOTE — Telephone Encounter (Signed)
Faxed appeal form along with office notes to [facility] . Waiting on response . Pt is aware

## 2021-12-23 NOTE — Telephone Encounter (Signed)
Spoke w/ [facility] and advised that we have the form for additional information that I will complete today and send back to them .

## 2021-12-27 ENCOUNTER — Telehealth: Payer: Self-pay | Admitting: Family Medicine

## 2021-12-27 NOTE — Telephone Encounter (Signed)
Spoke w/ Raquel Sarna and I advised that we faxed the [facility] appeal form on 12/23/21 to the [phone]. I told them I will refax again . They are claiming they did not receive the form

## 2021-12-27 NOTE — Telephone Encounter (Signed)
You can also scan it to your email first and then forward it from there in order to receive a confirmation page :) I can show you how

## 2021-12-27 NOTE — Telephone Encounter (Signed)
Caller name:  Fritz Pickerel @ [facility]  On DPR? :yes/no: No  Call back number:  [phone]  Provider they see: Birdie Riddle  Reason for call:  calling for additional information on denial for [facility] 1.'7mg'$ ; states that needs answers to some questions in order to try to approve medication

## 2021-12-28 NOTE — Telephone Encounter (Signed)
Spoke w/ Tyshll at [facility] and advised her that all of the encounter's w/ BMI has been faxed to # [phone] . She is updating the forms . I have notified the pt as well that we are still working on this PA appeal.

## 2021-12-28 NOTE — Telephone Encounter (Signed)
Caller name: Lola @ [facility] PA Appeals Dept  Call back number: [phone]   ref # [id number]  Provider they see:  Birdie Riddle  Reason for call: needs documentation on pt's BMI from the last 30 days

## 2021-12-30 NOTE — Telephone Encounter (Signed)
Still waiting on response

## 2022-01-04 NOTE — Telephone Encounter (Signed)
Was informed by the pt insurance he can take up to 30 days for a response on appeal

## 2022-01-05 NOTE — Telephone Encounter (Signed)
Spoke w/ Sandra Benitez and informed Sandra Benitez that her insurance denied her appeal for the [facility] stated even with the office notes and labs that she did meet the criteria for the injection . Sandra Benitez states she will let me know what she wants to do as in if she is going to pay out of pocket for the medication or hold off .

## 2022-01-05 NOTE — Telephone Encounter (Signed)
I have informed the pt and advised her that per Tyshll at [facility] states it could take up to 30 days to reach a decision .

## 2022-01-11 ENCOUNTER — Encounter: Payer: Self-pay | Admitting: Neurology

## 2022-01-11 ENCOUNTER — Ambulatory Visit (INDEPENDENT_AMBULATORY_CARE_PROVIDER_SITE_OTHER): Payer: 59 | Admitting: Neurology

## 2022-01-11 VITALS — BP 121/73 | HR 71 | Ht 64.0 in | Wt 164.0 lb

## 2022-01-11 DIAGNOSIS — G2581 Restless legs syndrome: Secondary | ICD-10-CM | POA: Diagnosis not present

## 2022-01-11 MED ORDER — GABAPENTIN 300 MG PO CAPS: 300.0000 mg | ORAL_CAPSULE | Freq: Two times a day (BID) | ORAL | 5 refills | Status: DC

## 2022-01-11 NOTE — Progress Notes (Signed)
Patient: Sandra Benitez Date of Birth: 1960-07-30  Reason for Visit: Follow up History from: Patient Primary Neurologist: Dr. Krista Blue   ASSESSMENT AND PLAN 61 y.o. year old female   1.  Restless leg syndrome  -Adding gabapentin 300 mg twice daily -Continue Requip 0.5 mg at bedtime, Sinemet 25/100 mg 1 tablet at bedtime -We discussed in the future, consider increasing gabapentin weaning off Requip or Sinemet -Recheck iron, ferritin panel for baseline, patient is on iron supplement -Follow-up with me in 6 months or sooner if needed  HISTORY  Sandra Benitez is a 61 year old female, seen in request by her primary care doctor Midge Minium, for evaluation of restless leg, bilateral hands tremor.  Initial evaluation was on June 24, 2020   I reviewed and summarized the referring note.PMHX Anxiety Lexapro '5mg'$  qhs.   She reported long history of restless leg symptoms, started since her 52s, or even younger, she described urge to move her leg, initially only when she was attempting to go to sleep, over the years, she has been treated with Sinemet, initially 25/100 mg 1 tablet a day, because of increased restless leg symptoms urge to move her legs before she goes to sleep, dosage was increased to 2 tablets since 2019,   Despite that, she continues to have slow worsening restless leg symptoms, she also noticed that occasionally during the day when she is very relaxed, such as where she is receiving pedicure, massage, recently she was started on Requip 0.5 mg every night to help her restless leg symptoms   She often takes her Requip 0.5 mg and Sinemet 25/100 mg 2 tablets every night around 8 PM 2 hours prior to her of sleep time, she usually do not have significant trouble going to sleep, even if she woke up in the middle of the night, she can usually go back to sleep fairly quickly   She began to experience bilateral lateral thigh area paresthesia since December 2021, sometimes burning discomfort,  was seen by orthopedic surgeon, reported no significant abnormality on MRI of lumbar spine   She also experienced neck pain, was treated with epidural injection, and gabapentin which has been very helpful   In addition she also complains of intermittent bilateral hands tremor, fairly symmetric, no limitation in her daily function  Update January 11, 2022 SS: Here today for follow-up, takes Requip 0.5 mg at bedtime, Sinemet 25/100 mg 1 tablet at bedtime. Sometimes in the morning, has some RLS symptoms. Before pedicure/massage/hair appointment will take an extra sinemet to keep RLS symptoms still. Is on Iron supplement. Is now down 30 lbs since initial visit.  REVIEW OF SYSTEMS: Out of a complete 14 system review of symptoms, the patient complains only of the following symptoms, and all other reviewed systems are negative.  See HPI  ALLERGIES: Allergies  Allergen Reactions   Penicillins Hives    Has patient had a PCN reaction causing immediate rash, facial/tongue/throat swelling, SOB or lightheadedness with hypotension: NO Has patient had a PCN reaction causing severe rash involving mucus membranes or skin necrosis: NO Has patient had a PCN reaction that required hospitalization NO Has patient had a PCN reaction occurring within the last 10 years: NO If all of the above answers are "NO", then may proceed with Cephalosporin use.   Other Hives    Fluoroquinolone--   Sulfa Antibiotics Hives   Bactrim [Sulfamethoxazole-Trimethoprim] Hives    HOME MEDICATIONS: Outpatient Medications Prior to Visit  Medication Sig Dispense Refill  ALPRAZolam (XANAX) 0.25 MG tablet TAKE 1 TABLET BY MOUTH 3 TIMES  DAILY AS NEEDED FOR ANXIETY 90 tablet 1   atomoxetine (STRATTERA) 60 MG capsule Take 1 capsule (60 mg total) by mouth daily. 90 capsule 1   carbidopa-levodopa (SINEMET IR) 25-100 MG tablet TAKE 2 TABLETS BY MOUTH AT  BEDTIME (Patient taking differently: Take 1 tablet by mouth at bedtime.) 180 tablet  3   Cholecalciferol (VITAMIN D3) 125 MCG (5000 UT) TABS Take 5,000 Units by mouth daily.     escitalopram (LEXAPRO) 5 MG tablet TAKE 1 TABLET BY MOUTH DAILY 90 tablet 3   estradiol (VIVELLE-DOT) 0.05 MG/24HR patch Place 1 patch (0.05 mg total) onto the skin 2 (two) times a week. Mondays & Thursdays. 8 patch 3   ferrous sulfate 325 (65 FE) MG tablet Take 325 mg by mouth daily with breakfast.     levonorgestrel (MIRENA) 20 MCG/24HR IUD 1 each by Intrauterine route once.     metoprolol tartrate (LOPRESSOR) 25 MG tablet Take 12.5 mg by mouth at bedtime.     progesterone (PROMETRIUM) 100 MG capsule Take 100 mg by mouth at bedtime.     rOPINIRole (REQUIP) 0.5 MG tablet TAKE 1 TABLET BY MOUTH AT  BEDTIME 90 tablet 3   Semaglutide-Weight Management (WEGOVY) 1.7 MG/0.75ML SOAJ Inject 1.7 mg into the skin once a week. (Patient not taking: Reported on 01/11/2022) 3 mL 3   No facility-administered medications prior to visit.    PAST MEDICAL HISTORY: Past Medical History:  Diagnosis Date   Anemia    IBS (irritable bowel syndrome)    PVC (premature ventricular contraction)    Sleep apnea    Yeast infection     PAST SURGICAL HISTORY: Past Surgical History:  Procedure Laterality Date   BIOPSY  04/10/2019   Procedure: BIOPSY;  Surgeon: Juanita Craver, MD;  Location: [facility];  Service: Endoscopy;;   BIOPSY  05/28/2020   Procedure: BIOPSY;  Surgeon: Carol Ada, MD;  Location: [facility];  Service: Endoscopy;;   COLONOSCOPY WITH PROPOFOL N/A 04/10/2019   Procedure: COLONOSCOPY WITH PROPOFOL;  Surgeon: Juanita Craver, MD;  Location: [facility];  Service: Endoscopy;  Laterality: N/A;   COLONOSCOPY WITH PROPOFOL N/A 12/02/2021   Procedure: COLONOSCOPY WITH PROPOFOL;  Surgeon: Carol Ada, MD;  Location: [facility];  Service: Gastroenterology;  Laterality: N/A;   FACIAL COSMETIC SURGERY     FLEXIBLE SIGMOIDOSCOPY N/A 05/28/2020   Procedure: FLEXIBLE SIGMOIDOSCOPY;  Surgeon: Carol Ada, MD;   Location: [facility];  Service: Endoscopy;  Laterality: N/A;   FLEXIBLE SIGMOIDOSCOPY N/A 12/31/2020   Procedure: FLEXIBLE SIGMOIDOSCOPY;  Surgeon: Carol Ada, MD;  Location: [facility];  Service: Endoscopy;  Laterality: N/A;   LASIK     POLYPECTOMY  04/10/2019   Procedure: POLYPECTOMY;  Surgeon: Juanita Craver, MD;  Location: [facility];  Service: Endoscopy;;   POLYPECTOMY  05/28/2020   Procedure: POLYPECTOMY;  Surgeon: Carol Ada, MD;  Location: [facility];  Service: Endoscopy;;   POLYPECTOMY  12/31/2020   Procedure: POLYPECTOMY;  Surgeon: Carol Ada, MD;  Location: [facility];  Service: Endoscopy;;   POLYPECTOMY  12/02/2021   Procedure: POLYPECTOMY;  Surgeon: Carol Ada, MD;  Location: [facility];  Service: Gastroenterology;;   [name]  04/10/2019   Procedure: [name];  Surgeon: Juanita Craver, MD;  Location: [facility];  Service: Endoscopy;;    FAMILY HISTORY: Family History  Problem Relation Age of Onset   Colon cancer Maternal Grandfather    Heart disease Father    Stroke  Father    Hypertension Father    Hyperlipidemia Father    Heart attack Father    Neuropathy Father    Heart disease Brother    Stroke Brother    Hypertension Brother    Diverticulitis Mother    Alcohol abuse Brother     SOCIAL HISTORY: Social History   Socioeconomic History   Marital status: Divorced    Spouse name: Not on file   Number of children: Not on file   Years of education: Not on file   Highest education level: Not on file  Occupational History   Occupation: distribution center    Comment: [employer]  Tobacco Use   Smoking status: Never   Smokeless tobacco: Never  Vaping Use   Vaping Use: Never used  Substance and Sexual Activity   Alcohol use: Yes    Comment: Occasional   Drug use: No   Sexual activity: Not Currently    Birth control/protection: I.U.D.    Comment: MIRENA inserted 09-20-09  Other Topics Concern   Not on file  Social History  Narrative   Not on file   Social Determinants of Health   Financial Resource Strain: Not on file  Food Insecurity: Not on file  Transportation Needs: Not on file  Physical Activity: Not on file  Stress: Not on file  Social Connections: Not on file  Intimate Partner Violence: Not on file    PHYSICAL EXAM  Vitals:   01/11/22 1033  BP: 121/73  Pulse: 71  Weight: 164 lb (74.4 kg)  Height: '5\' 4"'$  (1.626 m)   Body mass index is 28.15 kg/m.  Generalized: Well developed, in no acute distress  Neurological examination  Mentation: Alert oriented to time, place, history taking. Follows all commands speech and language fluent Cranial nerve II-XII: Pupils were equal round reactive to light. Extraocular movements were full, visual field were full on confrontational test. Facial sensation and strength were normal.  Head turning and shoulder shrug  were normal and symmetric. Motor: The motor testing reveals 5 over 5 strength of all 4 extremities. Good symmetric motor tone is noted throughout.  Sensory: Sensory testing is intact to soft touch on all 4 extremities. No evidence of extinction is noted.  Coordination: Cerebellar testing reveals good finger-nose-finger and heel-to-shin bilaterally.  No tremor was noted. Gait and station: Gait is normal. Tandem gait is normal.  Reflexes: Deep tendon reflexes are symmetric and normal bilaterally.   DIAGNOSTIC DATA (LABS, IMAGING, TESTING) - I reviewed patient records, labs, notes, testing and imaging myself where available.  Lab Results  Component Value Date   WBC 6.2 09/02/2021   HGB 13.9 09/02/2021   HCT 41.8 09/02/2021   MCV 90.8 09/02/2021   PLT 404.0 (H) 09/02/2021      Component Value Date/Time   NA 135 10/27/2021 0959   K 4.4 10/27/2021 0959   CL 101 10/27/2021 0959   CO2 28 10/27/2021 0959   GLUCOSE 89 10/27/2021 0959   BUN 20 10/27/2021 0959   CREATININE 0.75 10/27/2021 0959   CREATININE 0.69 06/06/2019 1514   CALCIUM 9.6  10/27/2021 0959   PROT 7.2 09/02/2021 0943   ALBUMIN 4.2 09/02/2021 0943   AST 14 09/02/2021 0943   ALT 13 09/02/2021 0943   ALKPHOS 73 09/02/2021 0943   BILITOT 0.6 09/02/2021 0943   GFRNONAA >60 05/25/2015 1026   GFRAA >60 05/25/2015 1026   Lab Results  Component Value Date   CHOL 248 (H) 09/02/2021   HDL 45.40  09/02/2021   LDLCALC 168 (H) 09/02/2021   TRIG 170.0 (H) 09/02/2021   CHOLHDL 5 09/02/2021   No results found for: "HGBA1C" Lab Results  Component Value Date   VITAMINB12 286 10/13/2019   Lab Results  Component Value Date   TSH 3.40 09/02/2021    Butler Denmark, AGNP-C, DNP 01/11/2022, 12:29 PM [facility] [address] [place], [place] [address] [phone]

## 2022-01-11 NOTE — Patient Instructions (Signed)
Add in gabapentin starting 300 mg at bedtime, can take an additional during the day if needed Check Iron panel  See you back in 6 months Keep the requip and Sinemet for now

## 2022-01-12 ENCOUNTER — Encounter: Payer: Self-pay | Admitting: Family Medicine

## 2022-01-12 LAB — IRON,TIBC AND FERRITIN PANEL
Ferritin: 112 ng/mL (ref 15–150)
Iron Saturation: 35 % (ref 15–55)
Iron: 118 ug/dL (ref 27–159)
Total Iron Binding Capacity: 340 ug/dL (ref 250–450)
UIBC: 222 ug/dL (ref 131–425)

## 2022-01-17 ENCOUNTER — Other Ambulatory Visit: Payer: Self-pay

## 2022-01-17 MED ORDER — WEGOVY 1.7 MG/0.75ML ~~LOC~~ SOAJ: 1.7000 mg | SUBCUTANEOUS | 3 refills | Status: DC

## 2022-01-25 ENCOUNTER — Other Ambulatory Visit: Payer: Self-pay

## 2022-01-25 MED ORDER — GABAPENTIN 300 MG PO CAPS: 300.0000 mg | ORAL_CAPSULE | Freq: Two times a day (BID) | ORAL | 1 refills | Status: DC

## 2022-01-25 NOTE — Progress Notes (Signed)
[facility] is requesting a 90 day supply of gabapentin. Rx sent.

## 2022-01-27 ENCOUNTER — Other Ambulatory Visit: Payer: Self-pay | Admitting: Family Medicine

## 2022-01-27 NOTE — Telephone Encounter (Signed)
Xanax 0.25 mg LOV: 10/27/21 Last Refill:08/18/21 Upcoming appt: 09/28/22

## 2022-04-10 ENCOUNTER — Encounter: Payer: Self-pay | Admitting: Family Medicine

## 2022-05-23 NOTE — Progress Notes (Unsigned)
HPI female never smoker followed for OSA,  restless leg syndrome, complicated by anemia, anxiety, IBS Unattended Home Sleep Test-11/10/2015-severe obstructive sleep apnea-AHI 46.6/hour, desaturation to 81%, body weight 168 pounds -----------------------------------------------------------------------------------------------------------------   05/17/21- 62--year-old female never smoker followed for OSA, Insomnia,  restless leg syndrome,complicated by IBS, Anxiety/ Depression,  -Requip, Sinemet,  CPAP auto 5-15/ [facility] Sinemet for Restless Legs 25-100 and Requip by PCP Download- compliance 7%, AHI 0.7/ hr Body weight today-177 lbs Covid vax-4 Phizer Flu vax-had -----Patient is doing good, no concerns Download reviewed and compliance goals emphasized.  She says pressure was getting too high.  She changed to a nasal mask. Her primary physician manages restless legs. Otherwise insomnia has done a little better.  Xanax helps.  05/25/22- 74--year-old female never smoker followed for OSA, Insomnia,  restless leg syndrome,complicated by IBS, Anxiety/ Depression,  -Requip, Sinemet,  CPAP auto 4-10/ [facility]   pressure changed LOV Sinemet for Restless Legs 25-100 and Requip by PCP Download- compliance Body weight today- Covid vax-4 Phizer Flu vax-had    ROS-see HPI    + = positive Constitutional:    weight loss, night sweats, fevers, chills, fatigue, lassitude. HEENT:    headaches, difficulty swallowing, tooth/dental problems, sore throat,       sneezing, itching, ear ache, nasal congestion, post nasal drip, snoring CV:    chest pain, orthopnea, PND, swelling in lower extremities, anasarca,                                                     dizziness, palpitations Resp:   shortness of breath with exertion or at rest.                productive cough,   non-productive cough, coughing up of blood.              change in color of mucus.  wheezing.   Skin:    rash or lesions. GI:  No-    heartburn, indigestion, abdominal pain, nausea, vomiting, diarrhea,                 change in bowel habits, loss of appetite GU: dysuria, change in color of urine, no urgency or frequency.   flank pain. MS:   joint pain, stiffness, decreased range of motion, back pain. Neuro-   +HPI Psych:  change in mood or affect.  depression or anxiety.   memory loss.  OBJ- Physical Exam General- Alert, Oriented, Affect-appropriate, Distress- none acute, + overweight Skin- rash-none, lesions- none, excoriation- none Lymphadenopathy- none Head- atraumatic            Eyes- Gross vision intact, PERRLA, conjunctivae and secretions clear            Ears- Hearing, canals-normal            Nose- Clear, no-Septal dev, mucus, polyps, erosion, perforation             Throat- Mallampati III-IV , mucosa clear , drainage- none, tonsils- atrophic Neck- flexible , trachea midline, no stridor , thyroid nl, carotid no bruit Chest - symmetrical excursion , unlabored           Heart/CV- RRR , no murmur , no gallop  , no rub, nl s1 s2                           -  JVD- none , edema- none, stasis changes- none, varices- none           Lung- clear to P&A, wheeze- none, cough- none , dullness-none, rub- none           Chest wall-  Abd-  Br/ Gen/ Rectal- Not done, not indicated Extrem- cyanosis- none, clubbing, none, atrophy- none, strength- nl Neuro-not restlesslegs are not moving while she is here.

## 2022-05-25 ENCOUNTER — Encounter: Payer: Self-pay | Admitting: Internal Medicine

## 2022-05-25 ENCOUNTER — Ambulatory Visit (INDEPENDENT_AMBULATORY_CARE_PROVIDER_SITE_OTHER): Payer: 59 | Admitting: Internal Medicine

## 2022-05-25 VITALS — BP 122/76 | HR 85 | Ht 64.5 in | Wt 158.0 lb

## 2022-05-25 DIAGNOSIS — G2581 Restless legs syndrome: Secondary | ICD-10-CM

## 2022-05-25 DIAGNOSIS — G4733 Obstructive sleep apnea (adult) (pediatric): Secondary | ICD-10-CM | POA: Diagnosis not present

## 2022-05-25 NOTE — Assessment & Plan Note (Signed)
Managed by her primary physician

## 2022-05-25 NOTE — Assessment & Plan Note (Signed)
On auto 5-15 she is noting aerophagia and belching now that she has lost weight. Plan-reduce pressure to fixed 5 CWP.  Consider updating sleep study and possible referral to consider oral appliance as future directions.

## 2022-05-25 NOTE — Patient Instructions (Signed)
Order- DME [facility]- please change CPAP to 5 cwp   Hope all surgeries do well !

## 2022-06-05 ENCOUNTER — Ambulatory Visit: Payer: 59 | Admitting: Family Medicine

## 2022-06-07 ENCOUNTER — Encounter: Payer: Self-pay | Admitting: Family Medicine

## 2022-06-07 MED ORDER — WEGOVY 0.25 MG/0.5ML ~~LOC~~ SOAJ: 0.2500 mg | SUBCUTANEOUS | 1 refills | Status: DC

## 2022-06-16 ENCOUNTER — Encounter: Payer: Self-pay | Admitting: Family Medicine

## 2022-06-16 ENCOUNTER — Ambulatory Visit (INDEPENDENT_AMBULATORY_CARE_PROVIDER_SITE_OTHER): Payer: 59 | Admitting: Family Medicine

## 2022-06-16 ENCOUNTER — Telehealth: Payer: Self-pay

## 2022-06-16 VITALS — BP 128/80 | HR 74 | Temp 98.2°F | Resp 17 | Ht 64.5 in | Wt 158.0 lb

## 2022-06-16 DIAGNOSIS — Z01818 Encounter for other preprocedural examination: Secondary | ICD-10-CM

## 2022-06-16 LAB — BASIC METABOLIC PANEL
BUN: 23 mg/dL (ref 6–23)
CO2: 28 mEq/L (ref 19–32)
Calcium: 10 mg/dL (ref 8.4–10.5)
Chloride: 101 mEq/L (ref 96–112)
Creatinine, Ser: 0.82 mg/dL (ref 0.40–1.20)
GFR: 78.06 mL/min (ref >60.00)
Glucose, Bld: 93 mg/dL (ref 70–99)
Potassium: 5 mEq/L (ref 3.5–5.1)
Sodium: 137 mEq/L (ref 135–145)

## 2022-06-16 LAB — CBC WITH DIFFERENTIAL/PLATELET
Basophils Absolute: 0 10*3/uL (ref 0.0–0.1)
Basophils Relative: 0.5 % (ref 0.0–3.0)
Eosinophils Absolute: 0.1 10*3/uL (ref 0.0–0.7)
Eosinophils Relative: 0.9 % (ref 0.0–5.0)
HCT: 41.7 % (ref 36.0–46.0)
Hemoglobin: 14.1 g/dL (ref 12.0–15.0)
Lymphocytes Relative: 32.1 % (ref 12.0–46.0)
Lymphs Abs: 2.2 10*3/uL (ref 0.7–4.0)
MCHC: 33.9 g/dL (ref 30.0–36.0)
MCV: 90.5 fl (ref 78.0–100.0)
Monocytes Absolute: 0.4 10*3/uL (ref 0.1–1.0)
Monocytes Relative: 5.9 % (ref 3.0–12.0)
Neutro Abs: 4.1 10*3/uL (ref 1.4–7.7)
Neutrophils Relative %: 60.6 % (ref 43.0–77.0)
Platelets: 460 10*3/uL — ABNORMAL HIGH (ref 150.0–400.0)
RBC: 4.61 Mil/uL (ref 3.87–5.11)
RDW: 13.4 % (ref 11.5–15.5)
WBC: 6.8 10*3/uL (ref 4.0–10.5)

## 2022-06-16 LAB — HEPATIC FUNCTION PANEL
ALT: 10 U/L (ref 0–35)
AST: 14 U/L (ref 0–37)
Albumin: 4.2 g/dL (ref 3.5–5.2)
Alkaline Phosphatase: 78 U/L (ref 39–117)
Bilirubin, Direct: 0.1 mg/dL (ref 0.0–0.3)
Total Bilirubin: 0.6 mg/dL (ref 0.2–1.2)
Total Protein: 7.4 g/dL (ref 6.0–8.3)

## 2022-06-16 LAB — APTT: aPTT: 33.6 s (ref 25.4–36.8)

## 2022-06-16 LAB — PROTIME-INR
INR: 1 ratio (ref 0.8–1.0)
Prothrombin Time: 11.1 s (ref 9.6–13.1)

## 2022-06-16 LAB — TSH: TSH: 2.9 u[IU]/mL (ref 0.35–5.50)

## 2022-06-16 MED ORDER — SAXENDA 18 MG/3ML ~~LOC~~ SOPN: PEN_INJECTOR | SUBCUTANEOUS | 0 refills | Status: AC

## 2022-06-16 MED ORDER — SCOPOLAMINE 1 MG/3DAYS TD PT72: 1.0000 | MEDICATED_PATCH | TRANSDERMAL | 0 refills | Status: DC

## 2022-06-16 NOTE — Telephone Encounter (Signed)
Surgical clearance form ,EKG , lab results and office note has all been faxed to surgeon office in [place] MD . Copy is in my yellow folder on my desk to the left of my laptop .

## 2022-06-16 NOTE — Progress Notes (Signed)
Subjective:    Sandra Benitez is a 62 y.o. female who presents to the office today for a preoperative consultation at the request of [place] who plans on performing breast lift and tummy tuck on April 12. This consultation is requested for the specific conditions prompting preoperative evaluation (i.e. because of potential affect on operative risk): OSA, tachycardia. Planned anesthesia: local. The patient has the following known anesthesia issues:  pt has never had general anesthesia . Patients bleeding risk: no recent abnormal bleeding, no remote history of abnormal bleeding, and no use of Ca-channel blockers. Patient does not have objections to receiving blood products if needed.  The following portions of the patient's history were reviewed and updated as appropriate: allergies, current medications, past family history, past medical history, past social history, past surgical history, and problem list.  Review of Systems A comprehensive review of systems was negative.    Objective:    BP 128/80   Pulse 74   Temp 98.2 F (36.8 C) (Temporal)   Resp 17   Ht 5' 4.5" (1.638 m)   Wt 158 lb (71.7 kg)   SpO2 100%   BMI 26.70 kg/m   General Appearance:    Alert, cooperative, no distress, appears stated age  Head:    Normocephalic, without obvious abnormality, atraumatic  Eyes:    PERRL, conjunctiva/corneas clear, EOM's intact both eyes  Ears:    Normal TM's and external ear canals, both ears  Nose:   Nares normal, septum midline, mucosa normal, no drainage    or sinus tenderness  Throat:   Lips, mucosa, and tongue normal; teeth and gums normal  Neck:   Supple, symmetrical, trachea midline, no adenopathy;    thyroid:  no enlargement/tenderness/nodules  Back:     Symmetric, no curvature, ROM normal, no CVA tenderness  Lungs:     Clear to auscultation bilaterally, respirations unlabored  Chest Wall:    No tenderness or deformity   Heart:    Regular rate and rhythm, S1  and S2 normal, no murmur, rub   or gallop  Breast Exam:    Deferred  Abdomen:     Soft, non-tender, bowel sounds active all four quadrants,    no masses, no organomegaly  Genitalia:    deferred  Rectal:    Extremities:   Extremities normal, atraumatic, no cyanosis or edema  Pulses:   2+ and symmetric all extremities  Skin:   Skin color, texture, turgor normal, no rashes or lesions  Lymph nodes:   Cervical, supraclavicular, and axillary nodes normal  Neurologic:   CNII-XII intact, normal strength, sensation and reflexes    throughout    Predictors of intubation difficulty:  Morbid obesity? no  Anatomically abnormal facies? no  Prominent incisors? no  Receding mandible? no  Short, thick neck? no  Neck range of motion: normal  Dentition: No chipped, loose, or missing teeth.  Cardiographics ECG: normal sinus rhythm, no blocks or conduction defects, no ischemic changes Echocardiogram: not done  Imaging Chest x-ray:  NA    Lab Review  Pending    Assessment:      62 y.o. female with planned surgery as above.   Known risk factors for perioperative complications:  OSA w/ CPAP    Difficulty with intubation is not anticipated.  Cardiac Risk Estimation: low  Current medications which may produce withdrawal symptoms if withheld perioperatively: NA    Plan:    1. Preoperative workup as follows ECG, hemoglobin, hematocrit, electrolytes,  creatinine, glucose, liver function studies, coagulation studies. 2. Change in medication regimen before surgery:  discontinue Wegovy . 3. Prophylaxis for cardiac events with perioperative beta-blockers: already on Metoprolol. 4. Invasive hemodynamic monitoring perioperatively: at the discretion of anesthesiologist. 5. Deep vein thrombosis prophylaxis postoperatively:regimen to be chosen by surgical team. 6. Surveillance for postoperative MI with ECG immediately postoperatively and on postoperative days 1 and 2 AND troponin levels 24 hours  postoperatively and on day 4 or hospital discharge (whichever comes first): at the discretion of anesthesiologist. 7. Other measures:  none

## 2022-06-16 NOTE — Telephone Encounter (Signed)
Left results on PT VM and everything has been attached to surgical clearance form

## 2022-06-16 NOTE — Telephone Encounter (Signed)
Left pt a vm stating lab results . Have printed labs and office note to fax with surgical clearance and EKG to surgeon office

## 2022-06-16 NOTE — Telephone Encounter (Signed)
-----   Message from Midge Minium, MD sent at 06/16/2022  3:41 PM EDT ----- Labs look great!  We will print a copy and include it when we fax the EKG and office note

## 2022-06-16 NOTE — Patient Instructions (Signed)
Follow up as needed or as scheduled We'll notify you of your lab results and make any changes if needed Keep up the good work- you look great!!! Call with any questions or concerns GOOD LUCK!!!

## 2022-06-28 ENCOUNTER — Encounter: Payer: Self-pay | Admitting: Family Medicine

## 2022-07-03 ENCOUNTER — Telehealth: Payer: Self-pay

## 2022-07-03 HISTORY — PX: BREAST REDUCTION SURGERY: SHX8

## 2022-07-03 HISTORY — PX: ABDOMINOPLASTY: SUR9

## 2022-07-03 NOTE — Telephone Encounter (Signed)
Do you know of the status of the PA for the pt Rx Saxenda or if it has been started ? Pt is asking for details

## 2022-07-04 ENCOUNTER — Encounter: Payer: Self-pay | Admitting: Internal Medicine

## 2022-07-06 ENCOUNTER — Other Ambulatory Visit: Payer: Self-pay

## 2022-07-06 DIAGNOSIS — G4733 Obstructive sleep apnea (adult) (pediatric): Secondary | ICD-10-CM

## 2022-07-06 NOTE — Telephone Encounter (Signed)
Patient Advocate Encounter   Received notification that prior authorization for Saxenda 18MG /3ML pen-injectors is required.   PA submitted on 07/06/2022 [place] [facility] Electronic Prior Authorization Form Status is pending

## 2022-07-06 NOTE — Telephone Encounter (Signed)
Download provided shows CPAP settings 5-15, but date range is 6/22-23- 10/21/21.  Order- DME [facility]. Please change autopap range to 4-10 as requested previously.  Install AirView or SD card for current download after this change has been made.

## 2022-07-10 NOTE — Telephone Encounter (Signed)
Pt had to stop her medication for a surgical procedure and will need to restart at the beginning of the titration doses to minimize side effects.

## 2022-07-10 NOTE — Telephone Encounter (Signed)
Do you wish to do an appeal?

## 2022-07-10 NOTE — Telephone Encounter (Signed)
Spoke with Onalee Hua in appeal Dept for [facility] and they have submitted an appeal for this rx .  Pt is aware it has been started and can take up to 24 hours to 14 days .

## 2022-07-10 NOTE — Telephone Encounter (Signed)
I will call and attempt a Appeal this afternoon on this

## 2022-07-10 NOTE — Telephone Encounter (Signed)
Pharmacy Patient Advocate Encounter  Received notification from [facility] that the request for prior authorization for Saxenda 18MG /3ML pen-injectors  has been denied due to not meeting the prior authorization requirement(s) - You have not completed at least 16 weeks of treatment with the maintenance dose of this drug.  Please be advised we currently do not have a Pharmacist to review denials, therefore you will need to process appeals accordingly as needed. Thanks for your support at this time.   You may call [phone] or fax [phone], to appeal. Jovita Gamma is also available (Key: [id number])

## 2022-07-18 ENCOUNTER — Telehealth: Payer: Self-pay | Admitting: Family Medicine

## 2022-07-18 NOTE — Telephone Encounter (Signed)
   Called pt left message for her to call office if she had questions. She has a physical scheduled 09/28/22 12:40p

## 2022-07-18 NOTE — Telephone Encounter (Signed)
I have called the number listed and left a VM for someone to return my call I called the pt and she states she does not know any Dr Elodia Florence

## 2022-07-20 ENCOUNTER — Ambulatory Visit: Payer: 59 | Admitting: Neurology

## 2022-07-25 NOTE — Telephone Encounter (Signed)
Did you get any info back?*

## 2022-07-25 NOTE — Telephone Encounter (Signed)
Where are you with this Appeal?

## 2022-09-28 ENCOUNTER — Ambulatory Visit (INDEPENDENT_AMBULATORY_CARE_PROVIDER_SITE_OTHER): Payer: 59 | Admitting: Family Medicine

## 2022-09-28 ENCOUNTER — Encounter: Payer: Self-pay | Admitting: Family Medicine

## 2022-09-28 ENCOUNTER — Telehealth: Payer: Self-pay | Admitting: Internal Medicine

## 2022-09-28 VITALS — BP 110/80 | HR 62 | Temp 98.2°F | Resp 17 | Ht 64.5 in | Wt 152.2 lb

## 2022-09-28 DIAGNOSIS — Z13220 Encounter for screening for lipoid disorders: Secondary | ICD-10-CM | POA: Diagnosis not present

## 2022-09-28 DIAGNOSIS — Z23 Encounter for immunization: Secondary | ICD-10-CM

## 2022-09-28 DIAGNOSIS — Z0000 Encounter for general adult medical examination without abnormal findings: Secondary | ICD-10-CM

## 2022-09-28 DIAGNOSIS — E663 Overweight: Secondary | ICD-10-CM | POA: Diagnosis not present

## 2022-09-28 DIAGNOSIS — F419 Anxiety disorder, unspecified: Secondary | ICD-10-CM

## 2022-09-28 DIAGNOSIS — G4733 Obstructive sleep apnea (adult) (pediatric): Secondary | ICD-10-CM

## 2022-09-28 LAB — CBC WITH DIFFERENTIAL/PLATELET
Basophils Absolute: 0 10*3/uL (ref 0.0–0.1)
Basophils Relative: 0.7 % (ref 0.0–3.0)
Eosinophils Absolute: 0.1 10*3/uL (ref 0.0–0.7)
Eosinophils Relative: 1.5 % (ref 0.0–5.0)
HCT: 42.4 % (ref 36.0–46.0)
Hemoglobin: 13.8 g/dL (ref 12.0–15.0)
Lymphocytes Relative: 41.5 % (ref 12.0–46.0)
Lymphs Abs: 2.6 10*3/uL (ref 0.7–4.0)
MCHC: 32.6 g/dL (ref 30.0–36.0)
MCV: 89.3 fl (ref 78.0–100.0)
Monocytes Absolute: 0.3 10*3/uL (ref 0.1–1.0)
Monocytes Relative: 4.9 % (ref 3.0–12.0)
Neutro Abs: 3.2 10*3/uL (ref 1.4–7.7)
Neutrophils Relative %: 51.4 % (ref 43.0–77.0)
Platelets: 442 10*3/uL — ABNORMAL HIGH (ref 150.0–400.0)
RBC: 4.75 Mil/uL (ref 3.87–5.11)
RDW: 13.3 % (ref 11.5–15.5)
WBC: 6.3 10*3/uL (ref 4.0–10.5)

## 2022-09-28 LAB — LIPID PANEL
Cholesterol: 232 mg/dL — ABNORMAL HIGH (ref 0–200)
HDL: 47.5 mg/dL
LDL Cholesterol: 161 mg/dL — ABNORMAL HIGH (ref 0–99)
NonHDL: 184.67
Total CHOL/HDL Ratio: 5
Triglycerides: 118 mg/dL (ref 0.0–149.0)
VLDL: 23.6 mg/dL (ref 0.0–40.0)

## 2022-09-28 LAB — VITAMIN D 25 HYDROXY (VIT D DEFICIENCY, FRACTURES): VITD: 84.17 ng/mL (ref 30.00–100.00)

## 2022-09-28 LAB — BASIC METABOLIC PANEL
BUN: 17 mg/dL (ref 6–23)
CO2: 28 mEq/L (ref 19–32)
Calcium: 9.9 mg/dL (ref 8.4–10.5)
Chloride: 102 mEq/L (ref 96–112)
Creatinine, Ser: 0.7 mg/dL (ref 0.40–1.20)
GFR: 94.2 mL/min (ref >60.00)
Glucose, Bld: 87 mg/dL (ref 70–99)
Potassium: 4.3 mEq/L (ref 3.5–5.1)
Sodium: 137 mEq/L (ref 135–145)

## 2022-09-28 LAB — HEPATIC FUNCTION PANEL
ALT: 14 U/L (ref 0–35)
AST: 15 U/L (ref 0–37)
Albumin: 4.4 g/dL (ref 3.5–5.2)
Alkaline Phosphatase: 78 U/L (ref 39–117)
Bilirubin, Direct: 0.1 mg/dL (ref 0.0–0.3)
Total Bilirubin: 0.4 mg/dL (ref 0.2–1.2)
Total Protein: 7.3 g/dL (ref 6.0–8.3)

## 2022-09-28 LAB — TSH: TSH: 1.53 u[IU]/mL (ref 0.35–5.50)

## 2022-09-28 MED ORDER — ESCITALOPRAM OXALATE 5 MG PO TABS
ORAL_TABLET | ORAL | 3 refills | Status: DC
Start: 2022-09-28 — End: 2023-08-06

## 2022-09-28 NOTE — Patient Instructions (Signed)
Follow up in 1 year or as needed We'll notify you of your lab results and make any changes if needed Keep up the good work on healthy diet and regular exercise- you look great!!! Call with any questions or concerns Stay Safe!  Stay Healthy! Have a great summer!!! 

## 2022-09-28 NOTE — Progress Notes (Signed)
   Subjective:    Patient ID: Sandra Benitez, female    DOB: September 09, 1960, 62 y.o.   MRN: [id number]  HPI CPE- UTD on colonoscopy, mammo, pap, Tdap.   Patient Care Team    Relationship Specialty Notifications Start End  Sheliah Hatch, MD PCP - General Family Medicine  09/22/15   Silverio Lay, MD Consulting Physician Obstetrics and Gynecology  09/22/15   Laurena Spies, MD Referring Physician Gastroenterology  09/22/15   Waymon Budge, MD Consulting Physician Pulmonary Disease  11/29/15      Health Maintenance  Topic Date Due  . Zoster Vaccines- Shingrix (1 of 2) Never done  . INFLUENZA VACCINE  11/02/2022  . DTaP/Tdap/Td (2 - Td or Tdap) 09/22/2023  . MAMMOGRAM  12/23/2023  . PAP SMEAR-Modifier  09/02/2024  . Colonoscopy  12/03/2026  . Hepatitis C Screening  Completed  . HIV Screening  Completed  . HPV VACCINES  Aged Out  . COVID-19 Vaccine  Discontinued      Review of Systems Patient reports no vision/ hearing changes, adenopathy,fever, weight change,  persistant/recurrent hoarseness , swallowing issues, chest pain, palpitations, edema, persistant/recurrent cough, hemoptysis, dyspnea (rest/exertional/paroxysmal nocturnal), gastrointestinal bleeding (melena, rectal bleeding), abdominal pain, significant heartburn, bowel changes, GU symptoms (dysuria, hematuria, incontinence), Gyn symptoms (abnormal  bleeding, pain),  syncope, focal weakness, memory loss, numbness & tingling, skin/hair/nail changes, abnormal bruising or bleeding, anxiety, or depression.     Objective:   Physical Exam General Appearance:    Alert, cooperative, no distress, appears stated age  Head:    Normocephalic, without obvious abnormality, atraumatic  Eyes:    PERRL, conjunctiva/corneas clear, EOM's intact both eyes  Ears:    Normal TM's and external ear canals, both ears  Nose:   Nares normal, septum midline, mucosa normal, no drainage    or sinus tenderness  Throat:   Lips, mucosa, and tongue normal;  teeth and gums normal  Neck:   Supple, symmetrical, trachea midline, no adenopathy;    Thyroid: no enlargement/tenderness/nodules  Back:     Symmetric, no curvature, ROM normal, no CVA tenderness  Lungs:     Clear to auscultation bilaterally, respirations unlabored  Chest Wall:    No tenderness or deformity   Heart:    Regular rate and rhythm, S1 and S2 normal, no murmur, rub   or gallop  Breast Exam:    Deferred to GYN  Abdomen:     Soft, non-tender, bowel sounds active all four quadrants,    no masses, no organomegaly  Genitalia:    Deferred to GYN  Rectal:    Extremities:   Extremities normal, atraumatic, no cyanosis or edema  Pulses:   2+ and symmetric all extremities  Skin:   Skin color, texture, turgor normal, no rashes or lesions  Lymph nodes:   Cervical, supraclavicular, and axillary nodes normal  Neurologic:   CNII-XII intact, normal strength, sensation and reflexes    throughout          Assessment & Plan:

## 2022-09-28 NOTE — Telephone Encounter (Signed)
[facility] is giving pt run around to get her cpap adjusted down back in feb, pt states she feels it was not adjusted,

## 2022-09-29 ENCOUNTER — Telehealth: Payer: Self-pay

## 2022-09-29 NOTE — Assessment & Plan Note (Signed)
Pt's PE WNL.  UTD on colonoscopy, pap, mammo, Tdap.  Check labs.  Anticipatory guidance provided.

## 2022-09-29 NOTE — Telephone Encounter (Signed)
-----   Message from Sheliah Hatch, MD sent at 09/29/2022  7:30 AM EDT ----- Total cholesterol and LDL remain above goal despite weight loss.  Again, this will improve w/ regular exercise but in the meantime, please add daily OTC Red Yeast Rice supplement to improve these numbers  Remainder of labs look great!

## 2022-09-29 NOTE — Telephone Encounter (Signed)
Pt seen results Via my chart  

## 2022-09-30 ENCOUNTER — Other Ambulatory Visit: Payer: Self-pay | Admitting: Family Medicine

## 2022-10-02 NOTE — Telephone Encounter (Signed)
Patient is requesting a refill of the following medications: Requested Prescriptions   Pending Prescriptions Disp Refills   ALPRAZolam (XANAX) 0.25 MG tablet [Pharmacy Med Name: ALPRAZolam 0.25 MG Oral Tablet] 90 tablet     Sig: TAKE 1 TABLET BY MOUTH 3 TIMES  DAILY AS NEEDED FOR ANXIETY    Date of patient request: 09/30/22 Last office visit: 09/28/22 Date of last refill: 01/27/22 Last refill amount: 90 Follow up time period per chart: 1 year

## 2022-10-04 ENCOUNTER — Telehealth: Payer: Self-pay | Admitting: Internal Medicine

## 2022-10-04 NOTE — Telephone Encounter (Signed)
Spoke with patient regarding prior message . Patient stated she doesn't feel like [facility] changed her setting on her CPAP. Patient stated she has been trying to contact them since February.Advised patient I will contact them and would contact her back

## 2022-10-04 NOTE — Telephone Encounter (Signed)
Spoke with patient regarding prior message .Advised patient that [facility] will contact her and patient stated she wanted to change face mask to nasal pillow. Advised patient I will put a new order in.   Patient's voice was understanding. Nothing else further needed at this time .

## 2022-10-04 NOTE — Telephone Encounter (Signed)
Patient states Adult and Pediactric Specialist needs office notes. States needs pressure settings change for CPAP machine. Patient phone number is [phone].

## 2022-10-04 NOTE — Telephone Encounter (Signed)
Spoke with [facility] regarding prior message . They did tell me the person that [facility] CPAP was not in the office yet and would contact the patient back when he come in.Advised to [facility] that patient was trying to get in contact with them since February. Patient was also wondering when she is due for a new CPAP machine .

## 2022-10-10 ENCOUNTER — Encounter: Payer: Self-pay | Admitting: Family Medicine

## 2022-10-10 NOTE — Telephone Encounter (Signed)
[facility] can print out of Epic they are part of [facility] not sure what else is needed

## 2022-10-13 ENCOUNTER — Encounter: Payer: Self-pay | Admitting: Internal Medicine

## 2022-10-17 ENCOUNTER — Other Ambulatory Visit: Payer: Self-pay

## 2022-10-17 DIAGNOSIS — G4733 Obstructive sleep apnea (adult) (pediatric): Secondary | ICD-10-CM

## 2022-10-17 NOTE — Telephone Encounter (Signed)
Order- DME [facility]- please replace old CPAP, auto 4-10, mask of choice, humidifier, supplies, and please add AirView/ card for download capability

## 2022-11-15 ENCOUNTER — Other Ambulatory Visit: Payer: Self-pay | Admitting: Family Medicine

## 2022-11-23 ENCOUNTER — Other Ambulatory Visit: Payer: Self-pay | Admitting: Family Medicine

## 2022-12-09 ENCOUNTER — Other Ambulatory Visit: Payer: Self-pay | Admitting: Neurology

## 2023-01-04 ENCOUNTER — Encounter: Payer: Self-pay | Admitting: Family Medicine

## 2023-01-04 ENCOUNTER — Ambulatory Visit: Payer: 59 | Admitting: Family Medicine

## 2023-01-04 VITALS — BP 106/66 | HR 64 | Temp 98.0°F | Ht 64.0 in | Wt 151.6 lb

## 2023-01-04 DIAGNOSIS — E785 Hyperlipidemia, unspecified: Secondary | ICD-10-CM

## 2023-01-04 MED ORDER — ROSUVASTATIN CALCIUM 10 MG PO TABS: 10.0000 mg | ORAL_TABLET | Freq: Every day | ORAL | 3 refills | Status: DC

## 2023-01-04 MED ORDER — ROSUVASTATIN CALCIUM 10 MG PO TABS: 10.0000 mg | ORAL_TABLET | Freq: Every day | ORAL | 0 refills | Status: DC

## 2023-01-04 NOTE — Progress Notes (Signed)
   Subjective:    Patient ID: Sandra Benitez, female    DOB: [birth year], 62 y.o.   MRN: [id number]  HPI Hyperlipidemia- pt's recent labs done through weight loss group were concerning to her.  LDL was high in June at 161.  Recent labs show LDL 153.  Total cholesterol has decreased to 217 (from 232).  Pt could not tolerate Red Yeast Rice.  + family hx of hyperlipidemia, CVA.   Review of Systems For ROS see HPI     Objective:   Physical Exam Vitals reviewed.  Constitutional:      General: She is not in acute distress.    Appearance: Normal appearance. She is well-developed. She is not ill-appearing.  HENT:     Head: Normocephalic and atraumatic.  Eyes:     Conjunctiva/sclera: Conjunctivae normal.     Pupils: Pupils are equal, round, and reactive to light.  Neck:     Thyroid: No thyromegaly.  Cardiovascular:     Rate and Rhythm: Normal rate and regular rhythm.     Pulses: Normal pulses.     Heart sounds: Normal heart sounds. No murmur heard. Pulmonary:     Effort: Pulmonary effort is normal. No respiratory distress.     Breath sounds: Normal breath sounds.  Abdominal:     General: There is no distension.     Palpations: Abdomen is soft.     Tenderness: There is no abdominal tenderness.  Musculoskeletal:     Cervical back: Normal range of motion and neck supple.  Lymphadenopathy:     Cervical: No cervical adenopathy.  Skin:    General: Skin is warm and dry.  Neurological:     General: No focal deficit present.     Mental Status: She is alert and oriented to person, place, and time.  Psychiatric:        Mood and Affect: Mood normal.        Behavior: Behavior normal.        Thought Content: Thought content normal.           Assessment & Plan:

## 2023-01-04 NOTE — Assessment & Plan Note (Signed)
Chronic problem.  She was not able to tolerate Red Yeast Rice.  Given family hx will start Crestor 10mg  daily.  Monitor LFTs.  Will follow.

## 2023-01-04 NOTE — Patient Instructions (Signed)
Schedule a lab visit in 6 weeks to check liver enzymes START the Rosuvastatin nightly Keep up the good work on healthy diet and regular exercise- you look great!!! Call with any questions or concerns Stay Safe!  Stay Healthy!

## 2023-01-09 ENCOUNTER — Encounter: Payer: Self-pay | Admitting: Neurology

## 2023-01-09 ENCOUNTER — Ambulatory Visit (INDEPENDENT_AMBULATORY_CARE_PROVIDER_SITE_OTHER): Payer: 59 | Admitting: Neurology

## 2023-01-09 VITALS — BP 114/71 | HR 67 | Ht 64.0 in | Wt 152.6 lb

## 2023-01-09 DIAGNOSIS — G2581 Restless legs syndrome: Secondary | ICD-10-CM

## 2023-01-09 MED ORDER — GABAPENTIN 300 MG PO CAPS: 300.0000 mg | ORAL_CAPSULE | Freq: Two times a day (BID) | ORAL | 3 refills | Status: DC

## 2023-01-09 NOTE — Patient Instructions (Signed)
Great to see you today!! You look wonderful!! He will continue the gabapentin. Call me if you need anything! See you in 1 year :)

## 2023-01-09 NOTE — Progress Notes (Signed)
Patient: Sandra Benitez Date of Birth: [birth year]  Reason for Visit: Follow up History from: Patient Primary Neurologist: Dr. Terrace Arabia   ASSESSMENT AND PLAN 62 y.o. year old female   1.  Restless leg syndrome  -Under very good control, continue gabapentin up to 300 mg twice daily -Has been able to discontinue Requip and Sinemet -She wishes to follow here on an annual basis, she has concerns about developing Parkinson's disease.  I will see her back in a year.  Meds ordered this encounter  Medications   gabapentin (NEURONTIN) 300 MG capsule    Sig: Take 1 capsule (300 mg total) by mouth 2 (two) times daily.    Dispense:  180 capsule    Refill:  3    Please send a replace/new response with 90-Day Supply if appropriate to maximize member benefit. Requesting 1 year supply.   HISTORY  Sandra Benitez is a 62 year old female, seen in request by her primary care doctor Sheliah Hatch, for evaluation of restless leg, bilateral hands tremor.  Initial evaluation was on June 24, 2020   I reviewed and summarized the referring note.PMHX Anxiety Lexapro 5mg  qhs.   She reported long history of restless leg symptoms, started since her 30s, or even younger, she described urge to move her leg, initially only when she was attempting to go to sleep, over the years, she has been treated with Sinemet, initially 25/100 mg 1 tablet a day, because of increased restless leg symptoms urge to move her legs before she goes to sleep, dosage was increased to 2 tablets since 2019,   Despite that, she continues to have slow worsening restless leg symptoms, she also noticed that occasionally during the day when she is very relaxed, such as where she is receiving pedicure, massage, recently she was started on Requip 0.5 mg every night to help her restless leg symptoms   She often takes her Requip 0.5 mg and Sinemet 25/100 mg 2 tablets every night around 8 PM 2 hours prior to her of sleep time, she usually do not have  significant trouble going to sleep, even if she woke up in the middle of the night, she can usually go back to sleep fairly quickly   She began to experience bilateral lateral thigh area paresthesia since December 2021, sometimes burning discomfort, was seen by orthopedic surgeon, reported no significant abnormality on MRI of lumbar spine   She also experienced neck pain, was treated with epidural injection, and gabapentin which has been very helpful   In addition she also complains of intermittent bilateral hands tremor, fairly symmetric, no limitation in her daily function  Update January 11, 2022 SS: Here today for follow-up, takes Requip 0.5 mg at bedtime, Sinemet 25/100 mg 1 tablet at bedtime. Sometimes in the morning, has some RLS symptoms. Before pedicure/massage/hair appointment will take an extra sinemet to keep RLS symptoms still. Is on Iron supplement. Is now down 30 lbs since initial visit.  Update January 09, 2023 SS: Doing great with RLS, only taking gabapentin 300 mg at bedtime, will take the other tablet PRN rarely. Is off Sinemet and Requip! Remains on oral iron. Has lost over 10 lbs since last seen. Had abdominoplasty and breast reduction. 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  Occasional cramps in feet, usually due to dehydration.  REVIEW OF SYSTEMS: Out of a complete 14 system review of symptoms, the patient complains only of the following  symptoms, and all other reviewed systems are negative.  See HPI  ALLERGIES: Allergies  Allergen Reactions   Penicillins Hives    Has patient had a PCN reaction causing immediate rash, facial/tongue/throat swelling, SOB or lightheadedness with hypotension: NO Has patient had a PCN reaction causing severe rash involving mucus membranes or skin necrosis: NO Has patient had a PCN reaction that required hospitalization NO Has patient had a PCN reaction occurring within the last 10 years:  NO If all of the above answers are "NO", then may proceed with Cephalosporin use.   Other Hives    Fluoroquinolone--   Sulfa Antibiotics Hives   Bactrim [Sulfamethoxazole-Trimethoprim] Hives    HOME MEDICATIONS: Outpatient Medications Prior to Visit  Medication Sig Dispense Refill   ALPRAZolam (XANAX) 0.25 MG tablet TAKE 1 TABLET BY MOUTH 3 TIMES  DAILY AS NEEDED FOR ANXIETY 90 tablet 0   Cholecalciferol (VITAMIN D3) 125 MCG (5000 UT) TABS Take 5,000 Units by mouth daily.     doxycycline (VIBRA-TABS) 100 MG tablet 1 tablet Orally twice a day for 10 days     escitalopram (LEXAPRO) 5 MG tablet TAKE 1 TABLET BY MOUTH DAILY 90 tablet 3   estradiol (VIVELLE-DOT) 0.05 MG/24HR patch Place 1 patch (0.05 mg total) onto the skin 2 (two) times a week. Mondays & Thursdays. 8 patch 3   fluorouracil (EFUDEX) 5 % cream Apply topically daily.     gabapentin (NEURONTIN) 300 MG capsule TAKE 1 CAPSULE BY MOUTH TWICE  DAILY 180 capsule 3   levonorgestrel (MIRENA) 20 MCG/24HR IUD 1 each by Intrauterine route once.     metoprolol tartrate (LOPRESSOR) 25 MG tablet Take 12.5 mg by mouth at bedtime.     progesterone (PROMETRIUM) 100 MG capsule Take 100 mg by mouth at bedtime.     rosuvastatin (CRESTOR) 10 MG tablet Take 1 tablet (10 mg total) by mouth daily. 30 tablet 0   SEMAGLUTIDE,0.25 OR 0.5MG /DOS, [place] Inject 2.5 mg into the skin once a week.     No facility-administered medications prior to visit.    PAST MEDICAL HISTORY: Past Medical History:  Diagnosis Date   Anemia    IBS (irritable bowel syndrome)    PVC (premature ventricular contraction)    Sleep apnea    Yeast infection     PAST SURGICAL HISTORY: Past Surgical History:  Procedure Laterality Date   ABDOMINOPLASTY  07/2022   BIOPSY  04/10/2019   Procedure: BIOPSY;  Surgeon: Charna Elizabeth, MD;  Location: [facility];  Service: Endoscopy;;   BIOPSY  05/28/2020   Procedure: BIOPSY;  Surgeon: Jeani Hawking, MD;  Location: [facility];   Service: Endoscopy;;   BREAST REDUCTION SURGERY Bilateral 07/2022   COLONOSCOPY WITH PROPOFOL N/A 04/10/2019   Procedure: COLONOSCOPY WITH PROPOFOL;  Surgeon: Charna Elizabeth, MD;  Location: [facility];  Service: Endoscopy;  Laterality: N/A;   COLONOSCOPY WITH PROPOFOL N/A 12/02/2021   Procedure: COLONOSCOPY WITH PROPOFOL;  Surgeon: Jeani Hawking, MD;  Location: [facility];  Service: Gastroenterology;  Laterality: N/A;   FACIAL COSMETIC SURGERY     FLEXIBLE SIGMOIDOSCOPY N/A 05/28/2020   Procedure: FLEXIBLE SIGMOIDOSCOPY;  Surgeon: Jeani Hawking, MD;  Location: [facility];  Service: Endoscopy;  Laterality: N/A;   FLEXIBLE SIGMOIDOSCOPY N/A 12/31/2020   Procedure: FLEXIBLE SIGMOIDOSCOPY;  Surgeon: Jeani Hawking, MD;  Location: [facility];  Service: Endoscopy;  Laterality: N/A;   LASIK     POLYPECTOMY  04/10/2019   Procedure: POLYPECTOMY;  Surgeon: Charna Elizabeth, MD;  Location: [facility];  Service:  Endoscopy;;   POLYPECTOMY  05/28/2020   Procedure: POLYPECTOMY;  Surgeon: Jeani Hawking, MD;  Location: [facility];  Service: Endoscopy;;   POLYPECTOMY  12/31/2020   Procedure: POLYPECTOMY;  Surgeon: Jeani Hawking, MD;  Location: [facility];  Service: Endoscopy;;   POLYPECTOMY  12/02/2021   Procedure: POLYPECTOMY;  Surgeon: Jeani Hawking, MD;  Location: [facility];  Service: Gastroenterology;;   Susa Day  04/10/2019   Procedure: Susa Day;  Surgeon: Charna Elizabeth, MD;  Location: [facility];  Service: Endoscopy;;    FAMILY HISTORY: Family History  Problem Relation Age of Onset   Colon cancer Maternal Grandfather    Heart disease Father    Stroke Father    Hypertension Father    Hyperlipidemia Father    Heart attack Father    Neuropathy Father    Heart disease Brother    Stroke Brother    Hypertension Brother    Diverticulitis Mother    Alcohol abuse Brother     SOCIAL HISTORY: Social History   Socioeconomic History   Marital status: Divorced    Spouse name:  Not on file   Number of children: Not on file   Years of education: Not on file   Highest education level: Some college, no degree  Occupational History   Occupation: distribution center    Comment: [employer]  Tobacco Use   Smoking status: Never   Smokeless tobacco: Never  Vaping Use   Vaping status: Never Used  Substance and Sexual Activity   Alcohol use: Yes    Comment: Occasional   Drug use: No   Sexual activity: Not Currently    Birth control/protection: I.U.D.    Comment: MIRENA inserted 09-20-09  Other Topics Concern   Not on file  Social History Narrative   Not on file   Social Determinants of Health   Financial Resource Strain: Low Risk  (12/31/2022)   Overall Financial Resource Strain (CARDIA)    Difficulty of Paying Living Expenses: Not hard at all  Food Insecurity: No Food Insecurity (12/31/2022)   Hunger Vital Sign    Worried About Running Out of Food in the Last Year: Never true    Ran Out of Food in the Last Year: Never true  Transportation Needs: No Transportation Needs (12/31/2022)   PRAPARE - Administrator, Civil Service (Medical): No    Lack of Transportation (Non-Medical): No  Physical Activity: Insufficiently Active (12/31/2022)   Exercise Vital Sign    Days of Exercise per Week: 2 days    Minutes of Exercise per Session: 60 min  Stress: No Stress Concern Present (12/31/2022)   [employer] of Occupational Health - Occupational Stress Questionnaire    Feeling of Stress : Only a little  Social Connections: Socially Isolated (12/31/2022)   Social Connection and Isolation Panel [NHANES]    Frequency of Communication with Friends and Family: More than three times a week    Frequency of Social Gatherings with Friends and Family: Once a week    Attends Religious Services: Never    Database administrator or Organizations: No    Attends Engineer, structural: Not on file    Marital Status: Divorced  Intimate Partner Violence: Not on  file   PHYSICAL EXAM  Vitals:   01/09/23 1256  BP: 114/71  Pulse: 67  Weight: 152 lb 9.6 oz (69.2 kg)  Height: 5\' 4"  (1.626 m)    Body mass index is 26.19 kg/m.  Generalized: Well developed, in no acute  distress  Neurological examination  Mentation: Alert oriented to time, place, history taking. Follows all commands speech and language fluent Cranial nerve II-XII: Pupils were equal round reactive to light. Extraocular movements were full, visual field were full on confrontational test. Facial sensation and strength were normal.  Head turning and shoulder shrug  were normal and symmetric. Motor: The motor testing reveals 5 over 5 strength of all 4 extremities. Good symmetric motor tone is noted throughout.  Sensory: Sensory testing is intact to soft touch on all 4 extremities. No evidence of extinction is noted.  Coordination: Cerebellar testing reveals good finger-nose-finger and heel-to-shin bilaterally.  No tremor was noted.  Minimal tremor translated into spiral draw.  Handwriting sample is well-maintained. Gait and station: Gait is normal. Tandem gait is normal.  Normal arm swing and turns. Reflexes: Deep tendon reflexes are symmetric and normal bilaterally.   DIAGNOSTIC DATA (LABS, IMAGING, TESTING) - I reviewed patient records, labs, notes, testing and imaging myself where available.  Lab Results  Component Value Date   WBC 6.3 09/28/2022   HGB 13.8 09/28/2022   HCT 42.4 09/28/2022   MCV 89.3 09/28/2022   PLT 442.0 (H) 09/28/2022      Component Value Date/Time   NA 137 09/28/2022 1323   K 4.3 09/28/2022 1323   CL 102 09/28/2022 1323   CO2 28 09/28/2022 1323   GLUCOSE 87 09/28/2022 1323   BUN 17 09/28/2022 1323   CREATININE 0.70 09/28/2022 1323   CREATININE 0.69 06/06/2019 1514   CALCIUM 9.9 09/28/2022 1323   PROT 7.3 09/28/2022 1323   ALBUMIN 4.4 09/28/2022 1323   AST 15 09/28/2022 1323   ALT 14 09/28/2022 1323   ALKPHOS 78 09/28/2022 1323   BILITOT 0.4  09/28/2022 1323   GFRNONAA >60 05/25/2015 1026   GFRAA >60 05/25/2015 1026   Lab Results  Component Value Date   CHOL 232 (H) 09/28/2022   HDL 47.50 09/28/2022   LDLCALC 161 (H) 09/28/2022   TRIG 118.0 09/28/2022   CHOLHDL 5 09/28/2022   No results found for: "HGBA1C" Lab Results  Component Value Date   VITAMINB12 286 10/13/2019   Lab Results  Component Value Date   TSH 1.53 09/28/2022    Margie Ege, AGNP-C, DNP 01/09/2023, 1:17 PM [facility] [address] [place], [place] [address] [phone]

## 2023-01-30 LAB — HM MAMMOGRAPHY

## 2023-02-07 LAB — HM PAP SMEAR
HPV 16/18/45 genotyping: NEGATIVE
HPV, high-risk: POSITIVE

## 2023-02-16 ENCOUNTER — Other Ambulatory Visit (INDEPENDENT_AMBULATORY_CARE_PROVIDER_SITE_OTHER): Payer: 59

## 2023-02-16 ENCOUNTER — Ambulatory Visit (INDEPENDENT_AMBULATORY_CARE_PROVIDER_SITE_OTHER): Payer: 59

## 2023-02-16 ENCOUNTER — Telehealth: Payer: Self-pay

## 2023-02-16 ENCOUNTER — Ambulatory Visit: Payer: 59

## 2023-02-16 DIAGNOSIS — E785 Hyperlipidemia, unspecified: Secondary | ICD-10-CM

## 2023-02-16 DIAGNOSIS — Z23 Encounter for immunization: Secondary | ICD-10-CM

## 2023-02-16 LAB — HEPATIC FUNCTION PANEL
ALT: 18 U/L (ref 0–35)
AST: 16 U/L (ref 0–37)
Albumin: 4.2 g/dL (ref 3.5–5.2)
Alkaline Phosphatase: 81 U/L (ref 39–117)
Bilirubin, Direct: 0.1 mg/dL (ref 0.0–0.3)
Total Bilirubin: 0.6 mg/dL (ref 0.2–1.2)
Total Protein: 6.8 g/dL (ref 6.0–8.3)

## 2023-02-16 NOTE — Progress Notes (Signed)
Sandra Benitez is a 62 y.o. female presents to the office today for Shingles vaccine dose #2 per physician's orders. Injection was administered Intramuscular Left deltoid.   Patient's Shingles series is complete no future doses required.  [name] [name]

## 2023-02-16 NOTE — Telephone Encounter (Signed)
-----   Message from Neena Rhymes sent at 02/16/2023  3:14 PM EST ----- Labs look great!  No changes at this time

## 2023-02-16 NOTE — Addendum Note (Signed)
Addended by: Eldred Manges on: 02/16/2023 10:28 AM   Modules accepted: Level of Service

## 2023-02-19 NOTE — Telephone Encounter (Signed)
Pt has been informed, noted she thought we would be checking lipid explained it was just a little too soon and that this was to ensure her body is metabolizing the new medication correctly and she was grateful for the explanation had no further questions

## 2023-05-26 NOTE — Progress Notes (Unsigned)
 HPI female never smoker followed for OSA,  restless leg syndrome, complicated by anemia, anxiety, IBS Unattended Home Sleep Test-11/10/2015-severe obstructive sleep apnea-AHI 46.6/hour, desaturation to 81%, body weight 168 pounds -----------------------------------------------------------------------------------------------------------------   05/25/22- 63--year-old female never smoker followed for OSA, Insomnia,  restless leg syndrome,complicated by IBS, Anxiety/ Depression,  -Requip, Sinemet,  CPAP auto 4-10/ [facility]   pressure changed LOV Sinemet for Restless Legs 25-100 and Requip by PCP Download- compliance   Body weight today-158 lbs Covid vax-4 Phizer Flu vax-had ------Pt feels like settings are too strong, wakes up belching  We have not been able to reach [facility] this morning, trying to get a download.  We had ordered pressure reduction last year to auto 4-10 but we do not think it got done.  She has lost weight and is now swallowing air with morning belching after CPAP.  She does still find that she sleeps better with CPAP.  She is headed for [place] today where first her daughter and then later she herself will be having surgery for breast reduction and tummy tuck's.  She knows to take her CPAP with her and she is going to take her AirMini machine.  We are sending an order to [facility] to set her CPAP pressure at fixed 5 but I do not know if that will get done before her surgery.  Gust possibility of updating her sleep study and opportunity for referral to consider an oral appliance if we cannot get CPAP pressure comfortable for her.  05/28/23- 63--year-old female never smoker followed for OSA, Insomnia,  restless leg syndrome,complicated by IBS, Anxiety/ Depression,  -Requip, Sinemet,  CPAP auto 4-10/ [facility]   pressure changed LOV Sinemet for Restless Legs 25-100 and Requip by PCP Download- compliance   Body weight today-  ROS-see HPI    + = positive Constitutional:    +weight  loss, night sweats, fevers, chills, fatigue, lassitude. HEENT:    headaches, difficulty swallowing, tooth/dental problems, sore throat,       sneezing, itching, ear ache, nasal congestion, post nasal drip, snoring CV:    chest pain, orthopnea, PND, swelling in lower extremities, anasarca,                                                     dizziness, palpitations Resp:   shortness of breath with exertion or at rest.                productive cough,   non-productive cough, coughing up of blood.              change in color of mucus.  wheezing.   Skin:    rash or lesions. GI:  No-   heartburn, indigestion, abdominal pain, nausea, vomiting, diarrhea,                 change in bowel habits, loss of appetite GU: dysuria, change in color of urine, no urgency or frequency.   flank pain. MS:   joint pain, stiffness, decreased range of motion, back pain. Neuro-   +HPI Psych:  change in mood or affect.  depression or anxiety.   memory loss.  OBJ- Physical Exam General- Alert, Oriented, Affect-appropriate, Distress- none acute,  Skin- rash-none, lesions- none, excoriation- none Lymphadenopathy- none Head- atraumatic            Eyes- Gross  vision intact, PERRLA, conjunctivae and secretions clear            Ears- Hearing, canals-normal            Nose- Clear, no-Septal dev, mucus, polyps, erosion, perforation             Throat- Mallampati III-IV , mucosa clear , drainage- none, tonsils- atrophic Neck- flexible , trachea midline, no stridor , thyroid nl, carotid no bruit Chest - symmetrical excursion , unlabored           Heart/CV- RRR , no murmur , no gallop  , no rub, nl s1 s2                           - JVD- none , edema- none, stasis changes- none, varices- none           Lung- clear to P&A, wheeze- none, cough- none , dullness-none, rub- none           Chest wall-  Abd-  Br/ Gen/ Rectal- Not done, not indicated Extrem- cyanosis- none, clubbing, none, atrophy- none, strength- nl Neuro-not  restlesslegs are not moving while she is here.

## 2023-05-28 ENCOUNTER — Encounter: Payer: Self-pay | Admitting: Internal Medicine

## 2023-05-28 ENCOUNTER — Ambulatory Visit (INDEPENDENT_AMBULATORY_CARE_PROVIDER_SITE_OTHER): Payer: 59 | Admitting: Internal Medicine

## 2023-05-28 VITALS — BP 104/71 | HR 64 | Temp 98.2°F | Resp 18 | Ht 64.0 in | Wt 153.0 lb

## 2023-05-28 DIAGNOSIS — G2581 Restless legs syndrome: Secondary | ICD-10-CM

## 2023-05-28 DIAGNOSIS — G4733 Obstructive sleep apnea (adult) (pediatric): Secondary | ICD-10-CM

## 2023-05-28 NOTE — Patient Instructions (Signed)
 Glad you are doing well. We can continue CPAP 5.   Please call if we can help

## 2023-07-27 ENCOUNTER — Encounter (INDEPENDENT_AMBULATORY_CARE_PROVIDER_SITE_OTHER): Payer: Self-pay | Admitting: Family Medicine

## 2023-07-27 DIAGNOSIS — E663 Overweight: Secondary | ICD-10-CM

## 2023-07-27 NOTE — Telephone Encounter (Signed)
 Patient is wanting to order Zepbound through [employer], patient is wondering if we can have the RX sent in?

## 2023-07-30 DIAGNOSIS — E663 Overweight: Secondary | ICD-10-CM | POA: Diagnosis not present

## 2023-07-30 NOTE — Telephone Encounter (Signed)
 Patient would like to start on the lowest dose and only 1 month supply starting out. Patient states she would like something to " jump start" her again.

## 2023-07-31 ENCOUNTER — Telehealth: Payer: Self-pay

## 2023-07-31 ENCOUNTER — Other Ambulatory Visit (HOSPITAL_COMMUNITY): Payer: Self-pay

## 2023-07-31 MED ORDER — TIRZEPATIDE-WEIGHT MANAGEMENT 2.5 MG/0.5ML ~~LOC~~ SOAJ: 2.5000 mg | SUBCUTANEOUS | 1 refills | Status: DC

## 2023-07-31 NOTE — Telephone Encounter (Signed)
 Pharmacy Patient Advocate Encounter   Received notification from CoverMyMeds that prior authorization for Zepbound 2.5MG /0.5ML pen-injectors is required/requested.   Insurance verification completed.   The patient is insured through [facility] .   Per test claim: PA required; PA submitted to above mentioned insurance via CoverMyMeds Key/confirmation #/EOC [id number] Status is pending

## 2023-07-31 NOTE — Addendum Note (Signed)
 Addended by: Hikaru Delorenzo K on: 07/31/2023 03:21 PM   Modules accepted: Orders

## 2023-07-31 NOTE — Telephone Encounter (Signed)
 [facility] VISIT   Patient agreed to [facility] visit and is aware that copayment and coinsurance may apply. Patient was treated using telemedicine according to accepted telemedicine protocols.  Subjective:   Patient wants to switch from compounded Ozempic to Zepbound  Patient Active Problem List   Diagnosis Date Noted   Overweight (BMI 25.0-29.9) 09/02/2021   Hyperlipidemia 09/02/2021   Concentration deficit 04/18/2021   Meralgia paresthetica 06/24/2020   Neck pain 02/23/2020   Anxiety 12/02/2019   Tachycardia 12/02/2019   Insomnia 10/13/2019   Drooping eyelid 10/13/2019   Physical exam 06/01/2016   Palpitations 11/29/2015   Anxiety and depression 11/29/2015   Restless leg syndrome 09/28/2015   Obstructive sleep apnea 09/22/2015   Social History   Tobacco Use   Smoking status: Never   Smokeless tobacco: Never  Substance Use Topics   Alcohol use: Yes    Comment: Occasional    Current Outpatient Medications:    tirzepatide (ZEPBOUND) 2.5 MG/0.5ML Pen, Inject 2.5 mg into the skin once a week., Disp: 2 mL, Rfl: 1   ALPRAZolam  (XANAX ) 0.25 MG tablet, TAKE 1 TABLET BY MOUTH 3 TIMES  DAILY AS NEEDED FOR ANXIETY, Disp: 90 tablet, Rfl: 0   Cholecalciferol (VITAMIN D3) 125 MCG (5000 UT) TABS, Take 5,000 Units by mouth daily., Disp: , Rfl:    escitalopram  (LEXAPRO ) 5 MG tablet, TAKE 1 TABLET BY MOUTH DAILY, Disp: 90 tablet, Rfl: 3   estradiol  (VIVELLE -DOT) 0.05 MG/24HR patch, Place 1 patch (0.05 mg total) onto the skin 2 (two) times a week. Mondays & Thursdays., Disp: 8 patch, Rfl: 3   gabapentin  (NEURONTIN ) 300 MG capsule, Take 1 capsule (300 mg total) by mouth 2 (two) times daily., Disp: 180 capsule, Rfl: 3   metoprolol tartrate (LOPRESSOR) 25 MG tablet, Take 12.5 mg by mouth at bedtime., Disp: , Rfl:    progesterone (PROMETRIUM) 100 MG capsule, Take 100 mg by mouth at bedtime., Disp: , Rfl:    rosuvastatin  (CRESTOR ) 10 MG tablet, Take 1 tablet (10 mg total) by mouth daily., Disp: 30  tablet, Rfl: 0  Allergies  Allergen Reactions   Penicillins Hives    Has patient had a PCN reaction causing immediate rash, facial/tongue/throat swelling, SOB or lightheadedness with hypotension: NO Has patient had a PCN reaction causing severe rash involving mucus membranes or skin necrosis: NO Has patient had a PCN reaction that required hospitalization NO Has patient had a PCN reaction occurring within the last 10 years: NO If all of the above answers are "NO", then may proceed with Cephalosporin use.   Other Hives    Fluoroquinolone--   Sulfa  Antibiotics Hives   Bactrim  [Sulfamethoxazole -Trimethoprim ] Hives    Assessment and Plan:   Diagnosis: overweight. Please see myChart communication and orders below.   No orders of the defined types were placed in this encounter.  Meds ordered this encounter  Medications   tirzepatide (ZEPBOUND) 2.5 MG/0.5ML Pen    Sig: Inject 2.5 mg into the skin once a week.    Dispense:  2 mL    Refill:  1    Laymon Priest, MD 07/31/2023  A total of 7 minutes were spent by me to personally review the patient-generated inquiry, review patient records and data pertinent to assessment of the patient's problem, develop a management plan including generation of prescriptions and/or orders, and on subsequent communication with the patient through secure the MyChart portal service.   There is no separately reported E/M service related to this service in the past 7  days nor does the patient have an upcoming soonest available appointment for this issue. This work was completed in less than 7 days.   The patient consented to this service today (see patient agreement prior to ongoing communication). Patient counseled regarding the need for in-person exam for certain conditions and was advised to call the office if any changing or worsening symptoms occur.   The codes to be used for the E/M service are: [x]   99421 for 5-10 minutes of time spent on the  inquiry. []   [id number] for 11-20 minutes. []   N440385 for 21+ minutes.

## 2023-08-01 NOTE — Telephone Encounter (Signed)
 Pharmacy Patient Advocate Encounter  Received notification from [facility] that Prior Authorization for ZEPBOUND 2.5MG /0.5ML has been DENIED.  Full denial letter will be uploaded to the media tab. See denial reason below.    PA #/Case ID/Reference #: [id number]

## 2023-08-05 ENCOUNTER — Other Ambulatory Visit: Payer: Self-pay | Admitting: Family Medicine

## 2023-08-05 DIAGNOSIS — F419 Anxiety disorder, unspecified: Secondary | ICD-10-CM

## 2023-08-06 ENCOUNTER — Other Ambulatory Visit: Payer: Self-pay

## 2023-08-06 MED ORDER — TIRZEPATIDE-WEIGHT MANAGEMENT 2.5 MG/0.5ML ~~LOC~~ SOAJ: 2.5000 mg | SUBCUTANEOUS | 1 refills | Status: DC

## 2023-08-08 MED ORDER — TIRZEPATIDE-WEIGHT MANAGEMENT 2.5 MG/0.5ML ~~LOC~~ SOLN: 2.5000 mg | SUBCUTANEOUS | 1 refills | Status: DC

## 2023-08-08 NOTE — Telephone Encounter (Signed)
 Patient is wondering if you were able to receive and provide additional information requested for [facility]? Patient states the request for additional information should have went to the prescribing provider.

## 2023-08-08 NOTE — Addendum Note (Signed)
 Addended by: Jayana Kotula E on: 08/08/2023 03:56 PM   Modules accepted: Orders

## 2023-09-04 MED ORDER — TIRZEPATIDE-WEIGHT MANAGEMENT 5 MG/0.5ML ~~LOC~~ SOLN: 5.0000 mg | SUBCUTANEOUS | 1 refills | Status: DC

## 2023-09-04 NOTE — Telephone Encounter (Signed)
 Patient states she is ready for the 5mg  dose of zepbound  and would like it sent through [facility]. Patient is wondering if you are okay with sending in the 5mg ?

## 2023-09-04 NOTE — Addendum Note (Signed)
 Addended by: Ladena Jacquez E on: 09/04/2023 12:24 PM   Modules accepted: Orders

## 2023-09-19 ENCOUNTER — Other Ambulatory Visit: Payer: Self-pay | Admitting: Family Medicine

## 2023-09-19 ENCOUNTER — Other Ambulatory Visit: Payer: Self-pay

## 2023-09-25 DIAGNOSIS — M79644 Pain in right finger(s): Secondary | ICD-10-CM | POA: Insufficient documentation

## 2023-10-01 NOTE — Telephone Encounter (Signed)
 Patient would like to increase dosage on zepbound  and if it could be sent to [facility] direct?

## 2023-10-02 MED ORDER — TIRZEPATIDE-WEIGHT MANAGEMENT 7.5 MG/0.5ML ~~LOC~~ SOLN: 7.5000 mg | SUBCUTANEOUS | 1 refills | Status: DC

## 2023-10-02 NOTE — Addendum Note (Signed)
 Addended by: Lajuan Kovaleski E on: 10/02/2023 12:31 PM   Modules accepted: Orders

## 2023-10-09 ENCOUNTER — Ambulatory Visit (INDEPENDENT_AMBULATORY_CARE_PROVIDER_SITE_OTHER): Admitting: Family Medicine

## 2023-10-09 ENCOUNTER — Encounter: Payer: Self-pay | Admitting: Family Medicine

## 2023-10-09 VITALS — BP 128/78 | HR 70 | Temp 98.3°F | Ht 64.0 in | Wt 155.2 lb

## 2023-10-09 DIAGNOSIS — Z23 Encounter for immunization: Secondary | ICD-10-CM

## 2023-10-09 DIAGNOSIS — E785 Hyperlipidemia, unspecified: Secondary | ICD-10-CM | POA: Diagnosis not present

## 2023-10-09 DIAGNOSIS — D1800 Hemangioma unspecified site: Secondary | ICD-10-CM | POA: Insufficient documentation

## 2023-10-09 DIAGNOSIS — C44729 Squamous cell carcinoma of skin of left lower limb, including hip: Secondary | ICD-10-CM | POA: Insufficient documentation

## 2023-10-09 DIAGNOSIS — Z0000 Encounter for general adult medical examination without abnormal findings: Secondary | ICD-10-CM | POA: Diagnosis not present

## 2023-10-09 DIAGNOSIS — L814 Other melanin hyperpigmentation: Secondary | ICD-10-CM | POA: Insufficient documentation

## 2023-10-09 DIAGNOSIS — Z87898 Personal history of other specified conditions: Secondary | ICD-10-CM | POA: Insufficient documentation

## 2023-10-09 DIAGNOSIS — L821 Other seborrheic keratosis: Secondary | ICD-10-CM | POA: Insufficient documentation

## 2023-10-09 DIAGNOSIS — D2370 Other benign neoplasm of skin of unspecified lower limb, including hip: Secondary | ICD-10-CM | POA: Insufficient documentation

## 2023-10-09 DIAGNOSIS — Z85828 Personal history of other malignant neoplasm of skin: Secondary | ICD-10-CM | POA: Insufficient documentation

## 2023-10-09 LAB — BASIC METABOLIC PANEL WITH GFR
BUN: 17 mg/dL (ref 6–23)
CO2: 29 meq/L (ref 19–32)
Calcium: 9.2 mg/dL (ref 8.4–10.5)
Chloride: 104 meq/L (ref 96–112)
Creatinine, Ser: 0.74 mg/dL (ref 0.40–1.20)
GFR: 87.48 mL/min (ref >60.00)
Glucose, Bld: 93 mg/dL (ref 70–99)
Potassium: 4.6 meq/L (ref 3.5–5.1)
Sodium: 137 meq/L (ref 135–145)

## 2023-10-09 LAB — CBC WITH DIFFERENTIAL/PLATELET
Basophils Absolute: 0 K/uL (ref 0.0–0.1)
Basophils Relative: 0.7 % (ref 0.0–3.0)
Eosinophils Absolute: 0.1 K/uL (ref 0.0–0.7)
Eosinophils Relative: 1.2 % (ref 0.0–5.0)
HCT: 39.1 % (ref 36.0–46.0)
Hemoglobin: 13.1 g/dL (ref 12.0–15.0)
Lymphocytes Relative: 36.5 % (ref 12.0–46.0)
Lymphs Abs: 2.1 K/uL (ref 0.7–4.0)
MCHC: 33.4 g/dL (ref 30.0–36.0)
MCV: 89 fl (ref 78.0–100.0)
Monocytes Absolute: 0.3 K/uL (ref 0.1–1.0)
Monocytes Relative: 5.9 % (ref 3.0–12.0)
Neutro Abs: 3.2 K/uL (ref 1.4–7.7)
Neutrophils Relative %: 55.7 % (ref 43.0–77.0)
Platelets: 394 K/uL (ref 150.0–400.0)
RBC: 4.39 Mil/uL (ref 3.87–5.11)
RDW: 13.1 % (ref 11.5–15.5)
WBC: 5.8 K/uL (ref 4.0–10.5)

## 2023-10-09 LAB — HEPATIC FUNCTION PANEL
ALT: 30 U/L (ref 0–35)
AST: 24 U/L (ref 0–37)
Albumin: 4.2 g/dL (ref 3.5–5.2)
Alkaline Phosphatase: 64 U/L (ref 39–117)
Bilirubin, Direct: 0.1 mg/dL (ref 0.0–0.3)
Total Bilirubin: 0.8 mg/dL (ref 0.2–1.2)
Total Protein: 6.9 g/dL (ref 6.0–8.3)

## 2023-10-09 LAB — LIPID PANEL
Cholesterol: 132 mg/dL (ref 0–200)
HDL: 47.4 mg/dL (ref >39.00)
LDL Cholesterol: 62 mg/dL (ref 0–99)
NonHDL: 84.97
Total CHOL/HDL Ratio: 3
Triglycerides: 116 mg/dL (ref 0.0–149.0)
VLDL: 23.2 mg/dL (ref 0.0–40.0)

## 2023-10-09 LAB — TSH: TSH: 1.83 u[IU]/mL (ref 0.35–5.50)

## 2023-10-09 NOTE — Progress Notes (Signed)
   Subjective:    Patient ID: Sandra Benitez, female    DOB: 10-14-1960, 63 y.o.   MRN: [id number]  HPI CPE- UTD on pap, mammo (will get records from GYN), colonoscopy.  Due for Tdap.  Patient Care Team    Relationship Specialty Notifications Start End  Mahlon Comer BRAVO, MD PCP - General Family Medicine  09/22/15   Darcel Pool, MD Consulting Physician Obstetrics and Gynecology  09/22/15   Kristie Lynwood LABOR, MD Referring Physician Gastroenterology  09/22/15   Neysa Reggy BIRCH, MD Consulting Physician Pulmonary Disease  11/29/15      Health Maintenance  Topic Date Due   Cervical Cancer Screening (HPV/Pap Cotest)  04/25/2020   DTaP/Tdap/Td (2 - Td or Tdap) 09/22/2023   INFLUENZA VACCINE  11/02/2023   MAMMOGRAM  12/23/2023   Colonoscopy  12/03/2026   Hepatitis C Screening  Completed   HIV Screening  Completed   Zoster Vaccines- Shingrix   Completed   Hepatitis B Vaccines  Aged Out   HPV VACCINES  Aged Out   Meningococcal B Vaccine  Aged Out   COVID-19 Vaccine  Discontinued      Review of Systems Patient reports no vision/ hearing changes, adenopathy,fever, weight change,  persistant/recurrent hoarseness , swallowing issues, chest pain, palpitations, edema, persistant/recurrent cough, hemoptysis, dyspnea (rest/exertional/paroxysmal nocturnal), gastrointestinal bleeding (melena, rectal bleeding), abdominal pain, significant heartburn, bowel changes, GU symptoms (dysuria, hematuria, incontinence), Gyn symptoms (abnormal  bleeding, pain),  syncope, focal weakness, memory loss, numbness & tingling, skin/hair/nail changes, abnormal bruising or bleeding, anxiety, or depression.     Objective:   Physical Exam General Appearance:    Alert, cooperative, no distress, appears stated age  Head:    Normocephalic, without obvious abnormality, atraumatic  Eyes:    PERRL, conjunctiva/corneas clear, EOM's intact both eyes  Ears:    Normal TM's and external ear canals, both ears  Nose:   Nares normal,  septum midline, mucosa normal, no drainage    or sinus tenderness  Throat:   Lips, mucosa, and tongue normal; teeth and gums normal  Neck:   Supple, symmetrical, trachea midline, no adenopathy;    Thyroid : no enlargement/tenderness/nodules  Back:     Symmetric, no curvature, ROM normal, no CVA tenderness  Lungs:     Clear to auscultation bilaterally, respirations unlabored  Chest Wall:    No tenderness or deformity   Heart:    Regular rate and rhythm, S1 and S2 normal, no murmur, rub   or gallop  Breast Exam:    Deferred to GYN  Abdomen:     Soft, non-tender, bowel sounds active all four quadrants,    no masses, no organomegaly  Genitalia:    Deferred to GYN  Rectal:    Extremities:   Extremities normal, atraumatic, no cyanosis or edema  Pulses:   2+ and symmetric all extremities  Skin:   Skin color, texture, turgor normal, no rashes or lesions  Lymph nodes:   Cervical, supraclavicular, and axillary nodes normal  Neurologic:   CNII-XII intact, normal strength, sensation and reflexes    throughout          Assessment & Plan:

## 2023-10-09 NOTE — Patient Instructions (Signed)
 Schedule your complete physical in 6 months We'll notify you of your lab results and make any changes if needed Keep up the good work on healthy diet and regular exercise- you look AMAZING!!! Call with any questions or concerns Stay Safe!  Stay Healthy! Have a great summer!!!

## 2023-10-09 NOTE — Assessment & Plan Note (Signed)
Pt's PE WNL.  UTD on pap, mammo, colonoscopy.  Tdap given.  Check labs.  Anticipatory guidance provided.  

## 2023-10-10 ENCOUNTER — Ambulatory Visit: Payer: Self-pay | Admitting: Family Medicine

## 2023-10-10 NOTE — Progress Notes (Signed)
 Pt has reviewed labs via MyChart

## 2023-10-28 ENCOUNTER — Other Ambulatory Visit: Payer: Self-pay | Admitting: Family

## 2023-10-28 MED ORDER — TIRZEPATIDE 10 MG/0.5ML ~~LOC~~ SOAJ
10.0000 mg | SUBCUTANEOUS | 1 refills | Status: DC
Start: 2023-10-28 — End: 2023-11-22

## 2023-10-29 ENCOUNTER — Other Ambulatory Visit: Payer: Self-pay

## 2023-10-29 MED ORDER — TIRZEPATIDE-WEIGHT MANAGEMENT 10 MG/0.5ML ~~LOC~~ SOLN: 10.0000 mg | SUBCUTANEOUS | 1 refills | Status: DC

## 2023-11-22 MED ORDER — MOUNJARO 12.5 MG/0.5ML ~~LOC~~ SOAJ: 12.5000 mg | SUBCUTANEOUS | 1 refills | Status: DC

## 2023-11-22 NOTE — Addendum Note (Signed)
 Addended by: Meir Elwood E on: 11/22/2023 02:01 PM   Modules accepted: Orders

## 2023-11-22 NOTE — Telephone Encounter (Signed)
 Patient is needing next dose in vial sent to [facility] direct. Okay to dose up?

## 2023-11-27 MED ORDER — ZEPBOUND 12.5 MG/0.5ML ~~LOC~~ SOLN: 0.5000 mL | SUBCUTANEOUS | 3 refills | Status: DC

## 2023-11-27 NOTE — Telephone Encounter (Signed)
 [facility] only has 2.5, 5, 7.5 and 10mg  vials. They do not have 12.5mg  vials.   Please advise and send new rx to [facility]. Thank you!

## 2023-11-27 NOTE — Addendum Note (Signed)
 Addended by: Elodia Haviland E on: 11/27/2023 10:31 AM   Modules accepted: Orders

## 2023-11-27 NOTE — Telephone Encounter (Signed)
 Response

## 2023-11-29 ENCOUNTER — Other Ambulatory Visit: Payer: Self-pay | Admitting: Obstetrics and Gynecology

## 2023-11-29 DIAGNOSIS — Z1231 Encounter for screening mammogram for malignant neoplasm of breast: Secondary | ICD-10-CM

## 2023-12-12 ENCOUNTER — Encounter: Payer: Self-pay | Admitting: Family

## 2023-12-12 DIAGNOSIS — R1909 Other intra-abdominal and pelvic swelling, mass and lump: Secondary | ICD-10-CM

## 2024-01-01 ENCOUNTER — Encounter: Payer: Self-pay | Admitting: Neurology

## 2024-01-03 ENCOUNTER — Ambulatory Visit: Payer: 59 | Admitting: Neurology

## 2024-01-09 ENCOUNTER — Ambulatory Visit: Payer: 59 | Admitting: Neurology

## 2024-01-20 ENCOUNTER — Other Ambulatory Visit: Payer: Self-pay | Admitting: Neurology

## 2024-01-21 NOTE — Telephone Encounter (Signed)
 Last seen on 01/09/23 No 1 year  follow up scheduled

## 2024-01-31 ENCOUNTER — Ambulatory Visit
Admission: RE | Admit: 2024-01-31 | Discharge: 2024-01-31 | Disposition: A | Discharge status: Home / Self Care | Source: Ambulatory Visit | Attending: Obstetrics and Gynecology | Admitting: Obstetrics and Gynecology

## 2024-01-31 DIAGNOSIS — Z1231 Encounter for screening mammogram for malignant neoplasm of breast: Secondary | ICD-10-CM

## 2024-02-13 NOTE — Progress Notes (Signed)
 HPI female never smoker followed for OSA,  restless leg syndrome, complicated by anemia, anxiety, IBS Unattended Home Sleep Test-11/10/2015-severe obstructive sleep apnea-AHI 46.6/hour, desaturation to 81%, body weight 168 pounds -----------------------------------------------------------------------------------------------------------------   05/28/23- 63--year-old female never smoker followed for OSA, Insomnia,  restless leg syndrome,complicated by IBS, Anxiety/ Depression,  -Xanax  .25, gabapentin  30 bid,  CPAP auto 5/ [facility]   pressure changed LOV Download- compliance  70%, AHI 1/hr Body weight today-153 lbs She is confident that she sleeps better with CPAP, so despite the low setting, we will continue.  Discussed the use of AI scribe software for clinical note transcription with the patient, who gave verbal consent to proceed. History of Present Illness   The patient, with a history of sleep apnea and restless leg syndrome, reports significant improvement in comfort and sleep quality since adjusting the CPAP machine to a fixed pressure of five and switching to nasal pillows. She notes a clear difference in her well-being on the nights she uses the CPAP machine compared to when she doesn't, despite the low pressure setting. The machine is a new model, an Airsense eleven, and is effectively suppressing breakthrough apneas.  Previously, the patient was on Sinemet  and Requip  for restless leg syndrome, but these have been discontinued in favor of gabapentin . The patient reports that gabapentin  is working even better for her, and she takes it mostly once a day at night, sometimes twice a day as needed. The drowsiness side effect of gabapentin  seems to be aiding her sleep.    02/14/24- 63 year old female never smoker followed for OSA, Insomnia,  restless leg syndrome,complicated by IBS, Anxiety/ Depression,  -Xanax  .25, gabapentin  30 bid,  CPAP auto 5/ [facility]    Download- compliance  Body  weight today- 135 lbs -------Not using CPAP.  Lost 67 lbs.  Feels better, sleeping great.  Felt like CPAP was causing too much pressure in face/ air in stomach. Discussed the use of AI scribe software for clinical note transcription with the patient, who gave verbal consent to proceed.  History of Present Illness   Sandra Benitez is a 63 year old female with obstructive sleep apnea and insomnia who presents for follow-up. She has been told she is difficult intubation.  She has experienced significant improvement in sleep quality after losing 67 pounds with the use of Zepbound . With this improvement, she wants to discontinue CPAP therapy due to discomfort, including air swallowing and gas pains, even at very low pressures. She has already stopped using the CPAP machine after experiencing belching and discomfort during the night. She mentions a history of difficult intubation during a previous appendectomy, which she attributes to her airway issues related to sleep apnea. Up to date flu and pneumonia vaccine this Fall.     Assessment and Plan:    Obstructive sleep apnea CPAP discontinued due to discomfort. Significant weight loss improved sleep quality. Satisfied without CPAP. - Discontinued CPAP use. - Consider alternative treatments such as mouthpieces if needed.  Insomnia Sleeping well without CPAP.  History of difficult airway Noted during appendectomy, likely related to obstructive sleep apnea. No current breathing problems. - Ensure chart is marked for difficult airway.     ROS-see HPI    + = positive Constitutional:    +weight loss, night sweats, fevers, chills, fatigue, lassitude. HEENT:    headaches, difficulty swallowing, tooth/dental problems, sore throat,       sneezing, itching, ear ache, nasal congestion, post nasal drip, snoring CV:    chest pain, orthopnea,  PND, swelling in lower extremities, anasarca,                                                     dizziness,  palpitations Resp:   shortness of breath with exertion or at rest.                productive cough,   non-productive cough, coughing up of blood.              change in color of mucus.  wheezing.   Skin:    rash or lesions. GI:  No-   heartburn, indigestion, abdominal pain, nausea, vomiting, diarrhea,                 change in bowel habits, loss of appetite GU: dysuria, change in color of urine, no urgency or frequency.   flank pain. MS:   joint pain, stiffness, decreased range of motion, back pain. Neuro-   +HPI Psych:  change in mood or affect.  depression or anxiety.   memory loss.  OBJ- Physical Exam General- Alert, Oriented, Affect-appropriate, Distress- none acute,  Skin- rash-none, lesions- none, excoriation- none Lymphadenopathy- none Head- atraumatic            Eyes- Gross vision intact, PERRLA, conjunctivae and secretions clear            Ears- Hearing, canals-normal            Nose- Clear, no-Septal dev, mucus, polyps, erosion, perforation             Throat- Mallampati III-IV , mucosa clear , drainage- none, tonsils- atrophic Neck- flexible , trachea midline, no stridor , thyroid  nl, carotid no bruit Chest - symmetrical excursion , unlabored           Heart/CV- RRR , no murmur , no gallop  , no rub, nl s1 s2                           - JVD- none , edema- none, stasis changes- none, varices- none           Lung- clear to P&A, wheeze- none, cough- none , dullness-none, rub- none           Chest wall-  Abd-  Br/ Gen/ Rectal- Not done, not indicated Extrem- cyanosis- none, clubbing, none, atrophy- none, strength- nl Neuro-not restlesslegs are not moving while she is here. Assessment and Plan    Obstructive Sleep Apnea Improved with CPAP at fixed pressure of 5 and nasal pillows. Noted subjective improvement in sleep quality with CPAP use. New machine (Airsense 11) within the last year or two. Apnea-Hypopnea Index (AHI) of 1/hour on current settings. -Continue current CPAP  settings.  Restless Leg Syndrome Previously on Sinemet  and Requip , now on Gabapentin  with better control. Taking mostly once daily at night, sometimes twice daily. -Continue Gabapentin  as needed, up to twice daily.  Follow-up in 1 year or sooner if any issues arise.

## 2024-02-14 ENCOUNTER — Ambulatory Visit: Admitting: Internal Medicine

## 2024-02-14 ENCOUNTER — Encounter: Payer: Self-pay | Admitting: Internal Medicine

## 2024-02-14 VITALS — BP 112/64 | HR 66 | Temp 98.3°F | Ht 64.0 in | Wt 135.6 lb

## 2024-02-14 DIAGNOSIS — G4733 Obstructive sleep apnea (adult) (pediatric): Secondary | ICD-10-CM | POA: Diagnosis not present

## 2024-02-14 DIAGNOSIS — G2581 Restless legs syndrome: Secondary | ICD-10-CM

## 2024-02-14 DIAGNOSIS — G4700 Insomnia, unspecified: Secondary | ICD-10-CM

## 2024-02-14 NOTE — Patient Instructions (Signed)
 I agree with stopping CPAP - really glad you are doing well  Please call if we can hlep

## 2024-02-18 ENCOUNTER — Other Ambulatory Visit: Payer: Self-pay | Admitting: Neurology

## 2024-02-18 ENCOUNTER — Encounter: Payer: Self-pay | Admitting: Internal Medicine

## 2024-03-17 ENCOUNTER — Other Ambulatory Visit: Payer: Self-pay | Admitting: Family Medicine

## 2024-10-09 ENCOUNTER — Encounter: Admitting: Family Medicine
